# Patient Record
Sex: Male | Born: 1970
Health system: Southern US, Community
[De-identification: ages and names within clinical notes are randomized; demographics above are authoritative.]

## PROBLEM LIST (undated history)

## (undated) DIAGNOSIS — G4730 Sleep apnea, unspecified: Secondary | ICD-10-CM

## (undated) DIAGNOSIS — R55 Syncope and collapse: Secondary | ICD-10-CM

## (undated) DIAGNOSIS — I493 Ventricular premature depolarization: Secondary | ICD-10-CM

## (undated) DIAGNOSIS — M1990 Unspecified osteoarthritis, unspecified site: Secondary | ICD-10-CM

## (undated) DIAGNOSIS — I10 Essential (primary) hypertension: Secondary | ICD-10-CM

## (undated) DIAGNOSIS — I499 Cardiac arrhythmia, unspecified: Secondary | ICD-10-CM

## (undated) DIAGNOSIS — T7840XA Allergy, unspecified, initial encounter: Secondary | ICD-10-CM

## (undated) DIAGNOSIS — E785 Hyperlipidemia, unspecified: Secondary | ICD-10-CM

## (undated) DIAGNOSIS — E291 Testicular hypofunction: Secondary | ICD-10-CM

## (undated) DIAGNOSIS — K219 Gastro-esophageal reflux disease without esophagitis: Secondary | ICD-10-CM

## (undated) HISTORY — DX: Ventricular premature depolarization: I49.3

## (undated) HISTORY — DX: Syncope and collapse: R55

## (undated) HISTORY — DX: Gastro-esophageal reflux disease without esophagitis: K21.9

## (undated) HISTORY — DX: Hyperlipidemia, unspecified: E78.5

## (undated) HISTORY — DX: Allergy, unspecified, initial encounter: T78.40XA

## (undated) HISTORY — PX: WISDOM TOOTH EXTRACTION: SHX21

## (undated) HISTORY — PX: VASECTOMY: SHX75

## (undated) HISTORY — DX: Essential (primary) hypertension: I10

## (undated) HISTORY — DX: Testicular hypofunction: E29.1

---

## 1989-12-17 HISTORY — PX: OTHER SURGICAL HISTORY: SHX169

## 2010-02-08 ENCOUNTER — Ambulatory Visit: Payer: Self-pay | Admitting: Family Medicine

## 2010-07-26 ENCOUNTER — Encounter: Payer: Self-pay | Admitting: Unknown Physician Specialty

## 2010-12-28 ENCOUNTER — Encounter: Payer: Self-pay | Admitting: Physician Assistant

## 2012-03-31 ENCOUNTER — Emergency Department: Payer: Self-pay | Admitting: Emergency Medicine

## 2012-03-31 LAB — COMPREHENSIVE METABOLIC PANEL
Alkaline Phosphatase: 51 U/L (ref 50–136)
Anion Gap: 9 (ref 7–16)
BUN: 14 mg/dL (ref 7–18)
Calcium, Total: 8.4 mg/dL — ABNORMAL LOW (ref 8.5–10.1)
Chloride: 101 mmol/L (ref 98–107)
Creatinine: 1 mg/dL (ref 0.60–1.30)
EGFR (Non-African Amer.): >60
Glucose: 104 mg/dL — ABNORMAL HIGH (ref 65–99)
Osmolality: 276 (ref 275–301)
SGOT(AST): 30 U/L (ref 15–37)
SGPT (ALT): 29 U/L
Sodium: 138 mmol/L (ref 136–145)
Total Protein: 7.7 g/dL (ref 6.4–8.2)

## 2012-03-31 LAB — CBC
HCT: 40.8 % (ref 40.0–52.0)
MCH: 31.1 pg (ref 26.0–34.0)
RBC: 4.47 10*6/uL (ref 4.40–5.90)
RDW: 13.3 % (ref 11.5–14.5)

## 2012-03-31 LAB — TROPONIN I: Troponin-I: <0.02 ng/mL

## 2012-04-24 ENCOUNTER — Encounter: Payer: Self-pay | Admitting: Cardiovascular Disease

## 2012-04-24 ENCOUNTER — Ambulatory Visit (INDEPENDENT_AMBULATORY_CARE_PROVIDER_SITE_OTHER): Payer: Private Health Insurance - Indemnity | Admitting: Cardiovascular Disease

## 2012-04-24 DIAGNOSIS — R0600 Dyspnea, unspecified: Secondary | ICD-10-CM | POA: Insufficient documentation

## 2012-04-24 DIAGNOSIS — R55 Syncope and collapse: Secondary | ICD-10-CM | POA: Insufficient documentation

## 2012-04-24 DIAGNOSIS — R079 Chest pain, unspecified: Secondary | ICD-10-CM

## 2012-04-24 DIAGNOSIS — R0602 Shortness of breath: Secondary | ICD-10-CM

## 2012-04-24 DIAGNOSIS — I10 Essential (primary) hypertension: Secondary | ICD-10-CM

## 2012-04-24 HISTORY — DX: Syncope and collapse: R55

## 2012-04-24 MED ORDER — NITROGLYCERIN 0.4 MG SL SUBL: 0.4000 mg | SUBLINGUAL_TABLET | SUBLINGUAL | Status: DC | PRN

## 2012-04-24 NOTE — Assessment & Plan Note (Addendum)
The biggest issue appears to be his near syncope episodes that occur randomly. They are concerning for vasovagal episodes. There is no clear documentation apart from some reports of bradycardia. I would agree with his event monitor and have asked him to keep a careful diary. We'll plan on meeting with him again after the monitor and his complete. I suggested he stay on his metoprolol 25 mg in the morning as often this can be used for vasovagal symptoms. We'll not advance the metoprolol given baseline bradycardia on today's visit. We could consider holding his losartan HCTZ if symptoms continue after his monitor is complete. We did discuss possible tilt table testing.

## 2012-04-24 NOTE — Assessment & Plan Note (Signed)
Symptoms of chest pain are atypical in nature though he does have some episodes with exertion. We did discuss other options for evaluation including Myoview, cardiac catheterization, CT scan of the heart or MRI. We will wait until the monitor is complete I discussed this with him at a later date. He certainly could have spasm. We have given him sublingual nitroglycerin and had suggested he cut these in half and try them when necessary while lying down for symptoms.

## 2012-04-24 NOTE — Progress Notes (Signed)
Patient ID: Devon Miranda, male    DOB: 12/07/1971, 41 y.o.   MRN: [id number]  HPI Comments: Devon Miranda is a very pleasant 41 year old gentleman with a history of low testosterone, hypertension ,  long history of near syncope episodes dating back to 2010-2011, with significant cardiac workup including stress test and echocardiogram also with episodes of chest pain with exertion, also anxiety/depression who presents by referral from Dr. Burnett Sheng for further evaluation.  He reports that symptoms started several years ago, one episode while he was driving. He felt nauseous, sweaty, feeling that he was going to pass out. He's had numerous episodes. Somewhat reported bradycardia. Workup in the emergency room has typically been negative. It has been suggested that he may have had vasovagal episodes. EKGs have documented PVCs. He used to have episodes after working out while at rest. He has had at least 4 or 5 episodes like this. He developed acute onset of sweating, appear cyanotic, pale and has to lie down. For recent episode in mid April 2013  For confusion and near syncope, he had CT head and MRI of brain which was normal. He was started on metoprolol 25 mg every morning after recent stress test at [facility]. Results are not available to [place] at this time. He reports that he was not able to treadmill more than several minutes secondary to nausea.  He also reports having chest pain. 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  His wife reports that he snores though she is uncertain if he has apnea.  EKG shows sinus bradycardia with rate 55 beats per minute, nonspecific ST abnormality Total cholesterol 162, LDL 66, triglycerides 313   Outpatient Encounter Prescriptions as of 04/24/2012  Medication Sig Dispense Refill  . ALPRAZolam (XANAX) 0.5 MG tablet Take 0.5 mg by mouth at bedtime as needed.      Marland Kitchen aspirin 81 MG tablet Take 81 mg by mouth daily.       . fenofibrate 160 MG tablet Take 160 mg by mouth daily.      . fish oil-omega-3 fatty acids 1000 MG capsule Take 1,200 g by mouth daily.      Marland Kitchen losartan-hydrochlorothiazide (HYZAAR) 100-25 MG per tablet Take 1 tablet by mouth daily.      . metoprolol tartrate (LOPRESSOR) 25 MG tablet Take 25 mg by mouth daily.      . nitroGLYCERIN (NITROSTAT) 0.4 MG SL tablet Place 1 tablet (0.4 mg total) under the tongue every 5 (five) minutes as needed for chest pain.  25 tablet  3  . omeprazole (PRILOSEC) 20 MG capsule Take 20 mg by mouth daily.      Marland Kitchen testosterone cypionate (DEPOTESTOTERONE CYPIONATE) 200 MG/ML injection Inject 200 mg into the muscle every 14 (fourteen) days.       Review of Systems  Constitutional: Negative.   HENT: Negative.   Eyes: Negative.   Respiratory: Positive for chest tightness and shortness of breath.   Cardiovascular: Positive for chest pain.  Gastrointestinal: Negative.   Musculoskeletal: Negative.   Skin: Negative.   Neurological: Negative.        Near syncope  Hematological: Negative.   Psychiatric/Behavioral: Negative.   All other systems reviewed and are negative.    BP 128/84  Pulse 62  Ht 6\' 1"  (1.854 m)  Wt 310 lb (140.615 kg)  BMI 40.90 kg/m2  Physical Exam  Nursing note and vitals reviewed. Constitutional: He is oriented to person, place,  and time. He appears well-developed and well-nourished.       Obese  HENT:  Head: Normocephalic.  Nose: Nose normal.  Mouth/Throat: Oropharynx is clear and moist.  Eyes: Conjunctivae are normal. Pupils are equal, round, and reactive to light.  Neck: Normal range of motion. Neck supple. No JVD present.  Cardiovascular: Normal rate, regular rhythm, S1 normal, S2 normal, normal heart sounds and intact distal pulses.  Exam reveals no gallop and no friction rub.   No murmur heard. Pulmonary/Chest: Effort normal and breath sounds normal. No respiratory distress. He has no wheezes. He has no rales. He exhibits no  tenderness.  Abdominal: Soft. Bowel sounds are normal. He exhibits no distension. There is no tenderness.  Musculoskeletal: Normal range of motion. He exhibits no edema and no tenderness.  Lymphadenopathy:    He has no cervical adenopathy.  Neurological: He is alert and oriented to person, place, and time. Coordination normal.  Skin: Skin is warm and dry. No rash noted. No erythema.  Psychiatric: He has a normal mood and affect. His behavior is normal. Judgment and thought content normal.           Assessment and Plan

## 2012-04-24 NOTE — Assessment & Plan Note (Signed)
Blood pressure is in an adequate range with no orthostasis noted on today's measurements (lying down, sitting, standing). Uncertain if losartan/HCTZ could be contributing to his symptoms. Potentially if symptoms persist, we could change this to an alternate medication.

## 2012-04-24 NOTE — Patient Instructions (Addendum)
You are doing well. No medication changes were made.  We will try to obtain the results of your even monitor when they are available  Please call us if you have new issues that need to be addressed before your next appt.  Your physician wants you to follow-up in: 2 months.  You will receive a reminder letter in the mail two months in advance. If you don't receive a letter, please call our office to schedule the follow-up appointment.

## 2013-09-21 ENCOUNTER — Ambulatory Visit (INDEPENDENT_AMBULATORY_CARE_PROVIDER_SITE_OTHER): Payer: BC Managed Care – PPO | Admitting: Cardiovascular Disease

## 2013-09-21 ENCOUNTER — Encounter: Payer: Self-pay | Admitting: Cardiovascular Disease

## 2013-09-21 VITALS — BP 120/92 | HR 59 | Ht 73.0 in | Wt 311.5 lb

## 2013-09-21 DIAGNOSIS — R55 Syncope and collapse: Secondary | ICD-10-CM

## 2013-09-21 DIAGNOSIS — I498 Other specified cardiac arrhythmias: Secondary | ICD-10-CM

## 2013-09-21 DIAGNOSIS — I10 Essential (primary) hypertension: Secondary | ICD-10-CM

## 2013-09-21 DIAGNOSIS — R0902 Hypoxemia: Secondary | ICD-10-CM

## 2013-09-21 DIAGNOSIS — R001 Bradycardia, unspecified: Secondary | ICD-10-CM | POA: Insufficient documentation

## 2013-09-21 DIAGNOSIS — R079 Chest pain, unspecified: Secondary | ICD-10-CM

## 2013-09-21 DIAGNOSIS — R0602 Shortness of breath: Secondary | ICD-10-CM

## 2013-09-21 MED ORDER — LISINOPRIL 40 MG PO TABS: 40.0000 mg | ORAL_TABLET | Freq: Every day | ORAL | Status: DC

## 2013-09-21 NOTE — Assessment & Plan Note (Signed)
He reports significant bradycardia at times. Bradycardia was seen on previous 30 day event monitor. May need further workup including loop monitor for erratic but persistent symptoms

## 2013-09-21 NOTE — Progress Notes (Signed)
Patient ID: Devon Miranda, male    DOB: 23-Oct-1971, 42 y.o.   MRN: [id number]  HPI Comments: Devon Miranda is a very pleasant 42 year-old gentleman with a history of low testosterone, hypertension, long history of near syncope episodes dating back to 2010-2011, with significant cardiac workup including stress test and echocardiogram also with episodes of chest pain with exertion, also anxiety/depression who presents by referral from Dr. Burnett Sheng for further evaluation.  On his last clinic visit, he reported symptoms started several years ago, episodes while he was driving. He felt nauseous, sweaty, feeling that he was going to pass out.  He reported having  bradycardia.  previous Workup in the emergency room had been negative.  previously suggested that he may have had vasovagal episodes. EKGs have documented PVCs. He used to have episodes after working out while at rest. He has had at least 4 or 5 episodes like this. He developed acute onset of sweating, appear cyanotic, pale and has to lie down.  episode in mid April 2013  For confusion and near syncope, he had CT head and MRI of brain which was normal. He was started on metoprolol 25 mg every morning after stress test at [facility].  He reports that he was not able to treadmill more than several minutes secondary to nausea.  also reported having chest pain. 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 On his previous office visit, his  wife reported that he snores though she is uncertain if he has apnea.  In followup today, he reports that symptoms have recurred. He was wearing a 30 day monitor at the time of his last visit that showed sinus bradycardia, some sinus arrhythmia, trigeminal PVCs x2. He reports having no significant symptoms with exertion. He is able to exercise such as on a treadmill or with aerobic and feels well. Most of his symptoms occur at rest. Also has symptoms when he lifts weights.  Sometimes has symptoms in bed when he is supine, significant palpitations when lying on his left side at times. Woken up at nighttime on several occasions with cold sweats, nausea, dizziness, feeling disoriented. Goes to the bathroom and vitals measured by his wife on return. Pulse rates in the high 40s, saturations in the 80s,  associated with symptoms. Vitals seem to improve as wife is monitoring them with improvement of his oxygenation and heart rate. He reports having heart rates occasionally in the 30s with symptoms.  Reports having several more episodes while driving. Cold sweats, nausea. Worse with caffeine. Reports having significant ectopy, sometimes with leg cramps. Takes Xanax and symptoms seemed to resolve  Rarely has sharp pains in his left chest that come on at rest. He does like muscle fasciculations. Total cholesterol 187, HDL 36, LDL 100  EKG shows sinus bradycardia with rate 59 beats per minute, nonspecific ST abnormality   Outpatient Encounter Prescriptions as of 09/21/2013  Medication Sig Dispense Refill  . ALPRAZolam (XANAX) 0.5 MG tablet Take 0.5 mg by mouth at bedtime as needed.      Marland Kitchen aspirin 81 MG tablet Take 81 mg by mouth daily.      . fenofibrate 160 MG tablet Take 160 mg by mouth daily.      . fish oil-omega-3 fatty acids 1000 MG capsule Take 1,200 g by mouth daily.      Marland Kitchen losartan-hydrochlorothiazide (HYZAAR) 100-25 MG per tablet Take 1 tablet by mouth daily.      Marland Kitchen  metoprolol succinate (TOPROL-XL) 25 MG 24 hr tablet Take 25 mg by mouth daily.       . nitroGLYCERIN (NITROSTAT) 0.4 MG SL tablet Place 1 tablet (0.4 mg total) under the tongue every 5 (five) minutes as needed for chest pain.  25 tablet  3  . omeprazole (PRILOSEC) 20 MG capsule Take 20 mg by mouth daily.      Marland Kitchen lisinopril (PRINIVIL,ZESTRIL) 40 MG tablet Take 1 tablet (40 mg total) by mouth daily.  30 tablet  3  . [DISCONTINUED] metoprolol tartrate (LOPRESSOR) 25 MG tablet Take 25 mg by mouth daily.       . [DISCONTINUED] testosterone cypionate (DEPOTESTOTERONE CYPIONATE) 200 MG/ML injection Inject 200 mg into the muscle every 14 (fourteen) days.       No facility-administered encounter medications on file as of 09/21/2013.    Review of Systems  Constitutional: Negative.   HENT: Negative.   Eyes: Negative.   Respiratory: Positive for chest tightness and shortness of breath.   Cardiovascular: Positive for chest pain.  Gastrointestinal: Positive for nausea.  Musculoskeletal: Negative.   Skin: Negative.   Neurological: Positive for dizziness.  Psychiatric/Behavioral: Negative.   All other systems reviewed and are negative.    BP 120/92  Pulse 59  Ht 6\' 1"  (1.854 m)  Wt 311 lb 8 oz (141.295 kg)  BMI 41.11 kg/m2  Physical Exam  Nursing note and vitals reviewed. Constitutional: He is oriented to person, place, and time. He appears well-developed and well-nourished.  Obese  HENT:  Head: Normocephalic.  Nose: Nose normal.  Mouth/Throat: Oropharynx is clear and moist.  Eyes: Conjunctivae are normal. Pupils are equal, round, and reactive to light.  Neck: Normal range of motion. Neck supple. No JVD present.  Cardiovascular: Normal rate, regular rhythm, S1 normal, S2 normal, normal heart sounds and intact distal pulses.  Exam reveals no gallop and no friction rub.   No murmur heard. Pulmonary/Chest: Effort normal and breath sounds normal. No respiratory distress. He has no wheezes. He has no rales. He exhibits no tenderness.  Abdominal: Soft. Bowel sounds are normal. He exhibits no distension. There is no tenderness.  Musculoskeletal: Normal range of motion. He exhibits no edema and no tenderness.  Lymphadenopathy:    He has no cervical adenopathy.  Neurological: He is alert and oriented to person, place, and time. Coordination normal.  Skin: Skin is warm and dry. No rash noted. No erythema.  Psychiatric: He has a normal mood and affect. His behavior is normal. Judgment and thought  content normal.      Assessment and Plan

## 2013-09-21 NOTE — Assessment & Plan Note (Signed)
Wife reports saturations in the low 80s at nighttime in the setting of symptoms. Suspect she may have some component of sleep apnea. They were not forthcoming with his snoring or apneic episodes. We did mention sleep study and they would like to think about this.

## 2013-09-21 NOTE — Assessment & Plan Note (Signed)
Etiology of his symptoms of nausea, dizziness, hypoxia, bradycardia is uncertain. Previous 30 day monitor was unrevealing though he does not report having significant symptoms while wearing the monitor. Previous workup has suggested anxiety or panic attacks. Unable to exclude arrhythmia as a cause of his symptoms given self-reported heart rates in the 30s, frequent ectopy/palpitations. We have offered repeat today monitor, 30 day monitor. Given the longevity of his symptoms which has been going on for several years, inability to identify rhythm when he has these episodes, could consider a implantable loop monitor. We'll have him discuss this with Dr. Sherryl Manges, EP.

## 2013-09-21 NOTE — Patient Instructions (Addendum)
Please hold the losartan hctz and start lisinopril 1/2 pill daily Cut the metoprolol in 1/2 If  Blood pressure is consistently elevated, go to a full pill of lisinopril  If palpitations get worse, take metoprolol 1/2 twice a day  Call the office in 2 weeks to let us know if symptoms are any better.  Please call the office if you would like to schedule a appt with Dr. Graciela Husbands Call if you would like a 30 day monitor or sleep study  Cardiograph app  Please call us if you have new issues that need to be addressed before your next appt.

## 2013-09-21 NOTE — Assessment & Plan Note (Signed)
Uncertain if his medications are playing a role in his symptoms. We have suggested he change his losartan HCTZ to lisinopril 20 mg daily. He will also cut his metoprolol in half.

## 2013-09-21 NOTE — Assessment & Plan Note (Signed)
Atypical chest pain. Unlikely secondary to underlying CAD. No further workup at this time

## 2013-09-29 ENCOUNTER — Ambulatory Visit (INDEPENDENT_AMBULATORY_CARE_PROVIDER_SITE_OTHER): Payer: BC Managed Care – PPO | Admitting: Internal Medicine

## 2013-09-29 ENCOUNTER — Encounter: Payer: Self-pay | Admitting: Internal Medicine

## 2013-09-29 VITALS — BP 122/88 | HR 61 | Ht 73.0 in | Wt 313.8 lb

## 2013-09-29 DIAGNOSIS — I498 Other specified cardiac arrhythmias: Secondary | ICD-10-CM

## 2013-09-29 DIAGNOSIS — R001 Bradycardia, unspecified: Secondary | ICD-10-CM

## 2013-09-29 DIAGNOSIS — R42 Dizziness and giddiness: Secondary | ICD-10-CM

## 2013-09-29 DIAGNOSIS — R0602 Shortness of breath: Secondary | ICD-10-CM

## 2013-09-29 DIAGNOSIS — R079 Chest pain, unspecified: Secondary | ICD-10-CM

## 2013-09-29 DIAGNOSIS — I10 Essential (primary) hypertension: Secondary | ICD-10-CM

## 2013-09-29 DIAGNOSIS — R55 Syncope and collapse: Secondary | ICD-10-CM

## 2013-09-29 NOTE — Progress Notes (Signed)
ELECTROPHYSIOLOGY CONSULT NOTE  Patient ID: Devon Miranda, MRN: [id number], DOB/AGE: 06-27-1971 42 y.o. Admit date: (Not on file) Date of Consult: 09/29/2013  Primary Physician: Jerl Mina, MD Primary Cardiologist:TG  Chief Complaint:  Near syncope   HPI Devon Miranda is a 42 y.o. male seen because of episodes of weakness and the fear of impending doom.  He has a 2-3 year history of recurrent episodes of syncope that are associated with post exercise nausea weakness and collapse. Frequently they're triggered by Valsalva i.e. Lifting weights. He has a history of shower intolerance, heat intolerance and orthostatic intolerance as well as Jacuzzi intolerance.  He has seen a number of physicians for this over the last few years and has been told that this may be related to too much exercise or too little exercise or anxiety.  The patient also has spells are associated with a sense of impending doom. These occasionally awaken him at night. They can occur while seated. They're frequently associated with a feeling that his heart rate is falling and his wife, who is a Engineer, civil (consulting), has taken his pulse and noted to be 40. He also takes his pulse and occasionally feels regular irregularity with a post pause accentuation  He has no problems with exercise intolerance.  He has nocturnal obstructive sleep patterns He has a history of hypertension . Diuretics have been used in the past; he may have made this worse.    Past Medical History  Diagnosis Date  . Other testicular hypofunction   . Unspecified essential hypertension   . Essential hypertension, benign   . Other and unspecified hyperlipidemia       Surgical History:  Past Surgical History  Procedure Laterality Date  . Arthroscopic   1991    left knee surgery     Home Meds: Prior to Admission medications   Medication Sig Start Date End Date Taking? Authorizing Provider  ALPRAZolam Prudy Feeler) 0.5 MG tablet Take 0.5 mg by mouth at  bedtime as needed.   Yes Historical Provider, MD  aspirin 81 MG tablet Take 81 mg by mouth daily.   Yes Historical Provider, MD  fenofibrate 160 MG tablet Take 160 mg by mouth daily.   Yes Historical Provider, MD  fish oil-omega-3 fatty acids 1000 MG capsule Take 1,200 g by mouth daily.   Yes Historical Provider, MD  lisinopril (PRINIVIL,ZESTRIL) 40 MG tablet Take 1 tablet (40 mg total) by mouth daily. 09/21/13  Yes Antonieta Iba, MD  metoprolol succinate (TOPROL-XL) 25 MG 24 hr tablet Take 12.5 mg by mouth daily.  08/28/13  Yes Historical Provider, MD  nitroGLYCERIN (NITROSTAT) 0.4 MG SL tablet Place 1 tablet (0.4 mg total) under the tongue every 5 (five) minutes as needed for chest pain. 04/24/12 09/29/13 Yes Antonieta Iba, MD  omeprazole (PRILOSEC) 20 MG capsule Take 20 mg by mouth daily.   Yes Historical Provider, MD    Allergies: No Known Allergies  History   Social History  . Marital Status: Married    Spouse Name: N/A    Number of Children: N/A  . Years of Education: N/A   Occupational History  . Not on file.   Social History Main Topics  . Smoking status: Never Smoker   . Smokeless tobacco: Not on file  . Alcohol Use: No     Comment: occassionally  . Drug Use: No  . Sexual Activity: Not on file   Other Topics Concern  . Not on file   Social History Narrative  .  No narrative on file     Family History  Problem Relation Age of Onset  . Family history unknown: Yes     ROS:  Please see the history of present illness.     All other systems reviewed and negative.    Physical Exam:  Blood pressure 122/88, pulse 61, height 6\' 1"  (1.854 m), weight 313 lb 12 oz (142.316 kg). General: Well developed, well nourished male in no acute distress. Head: Normocephalic, atraumatic, sclera non-icteric, no xanthomas, nares are without discharge. EENT: normal Lymph Nodes:  none Back: without scoliosis/kyphosis  no CVA tendersness Neck: Negative for carotid bruits. JVD not  elevated. Lungs: Clear bilaterally to auscultation without wheezes, rales, or rhonchi. Breathing is unlabored. Heart: RRR with S1 S2. 2/6systolic  murmur , rubs, or gallops appreciated. Abdomen: Soft, non-tender, non-distended with normoactive bowel sounds. No hepatomegaly. No rebound/guarding. No obvious abdominal masses. Msk:  Strength and tone appear normal for age. Extremities: No clubbing or cyanosis. No + edema.  Distal pedal pulses are 2+ and equal bilaterally. Skin: Warm and Dry Neuro: Alert and oriented X 3. CN III-XII intact Grossly normal sensory and motor function . Psych:  Responds to questions appropriately with a normal affect.      L  Radiology/Studies:  No results found.  EKG: sinus rhythm without conduction block with PVCs   Assessment and Plan:    Devon Miranda

## 2013-09-29 NOTE — Assessment & Plan Note (Signed)
The patient has recurrent episodes of abrupt onset presyncope associated with epiphenomena consistent with a neurally mediated syndrome. These episodes are associated with a abrupt change in heart rate and has been documented in the 30-40 range. He has been noted by monitoring to have PVCs. I wonder whether this is been triggered by bigeminy. Event recorder should help to elucidate this. It is also possible this is sinus bradycardia and is occurring as a consequence of a neurally mediated trigger. With his GI symptoms potentially levsin might be helpful

## 2013-09-29 NOTE — Patient Instructions (Addendum)
Your physician has recommended that you wear an event monitor. Event monitors are medical devices that record the heart's electrical activity. Doctors most often [place] these monitors to diagnose arrhythmias. Arrhythmias are problems with the speed or rhythm of the heartbeat. The monitor is a small, portable device. You can wear one while you do your normal daily activities. This is usually used to diagnose what is causing palpitations/syncope (passing out).  Your physician recommends that you schedule a follow-up appointment in: 5-6 weeks with Dr. Graciela Husbands.  Your physician recommends that you continue on your current medications as directed. Please refer to the Current Medication list given to you today.

## 2013-09-29 NOTE — Assessment & Plan Note (Signed)
May be related to sleep apnea   Needs to have sleep study

## 2013-09-29 NOTE — Assessment & Plan Note (Signed)
As above.

## 2013-10-07 ENCOUNTER — Telehealth: Payer: Self-pay | Admitting: *Deleted

## 2013-10-07 NOTE — Telephone Encounter (Signed)
Spoke with pt today due to [facility] being unable to contact pt due to him having monitor but pt has not placed monitor on. Pt mentioned that he is out of town and will be back on Monday 10/12/13 and will contact [facility].

## 2013-10-09 DIAGNOSIS — I4949 Other premature depolarization: Secondary | ICD-10-CM

## 2013-10-12 ENCOUNTER — Encounter: Payer: Self-pay | Admitting: *Deleted

## 2013-10-19 ENCOUNTER — Ambulatory Visit: Payer: BC Managed Care – PPO | Admitting: Internal Medicine

## 2013-10-22 ENCOUNTER — Other Ambulatory Visit: Payer: Self-pay

## 2013-11-06 ENCOUNTER — Encounter: Payer: Self-pay | Admitting: Internal Medicine

## 2013-11-06 ENCOUNTER — Ambulatory Visit (INDEPENDENT_AMBULATORY_CARE_PROVIDER_SITE_OTHER): Payer: BC Managed Care – PPO | Admitting: Internal Medicine

## 2013-11-06 VITALS — BP 136/84 | HR 81 | Ht 73.0 in | Wt 314.8 lb

## 2013-11-06 DIAGNOSIS — R079 Chest pain, unspecified: Secondary | ICD-10-CM

## 2013-11-06 DIAGNOSIS — F411 Generalized anxiety disorder: Secondary | ICD-10-CM

## 2013-11-06 DIAGNOSIS — F419 Anxiety disorder, unspecified: Secondary | ICD-10-CM

## 2013-11-06 DIAGNOSIS — I498 Other specified cardiac arrhythmias: Secondary | ICD-10-CM

## 2013-11-06 DIAGNOSIS — R001 Bradycardia, unspecified: Secondary | ICD-10-CM

## 2013-11-06 DIAGNOSIS — I4949 Other premature depolarization: Secondary | ICD-10-CM

## 2013-11-06 DIAGNOSIS — R42 Dizziness and giddiness: Secondary | ICD-10-CM

## 2013-11-06 DIAGNOSIS — R0602 Shortness of breath: Secondary | ICD-10-CM

## 2013-11-06 DIAGNOSIS — I493 Ventricular premature depolarization: Secondary | ICD-10-CM | POA: Insufficient documentation

## 2013-11-06 DIAGNOSIS — R55 Syncope and collapse: Secondary | ICD-10-CM

## 2013-11-06 HISTORY — DX: Ventricular premature depolarization: I49.3

## 2013-11-06 NOTE — Patient Instructions (Addendum)
Your physician recommends that you continue on your current medications as directed. Please refer to the Current Medication list given to you today.  Your physician recommends that you schedule a follow-up appointment in: 2 months with Dr. Graciela Husbands   If needed, we can look into resources in [place] for needs.

## 2013-11-06 NOTE — Assessment & Plan Note (Signed)
The patient has struggled with significant anxiety and secondary depression associated with his PVCs  we have discussed a variety of strategies including biofeedback, imipramine based on the syndrome X. work, flecainide as a PVC suppressant. We have also discussed the potential value of counseling, with new strategies for dealing with his anxiety and to try and help prevent the collateral damage

## 2013-11-06 NOTE — Progress Notes (Signed)
      Patient Care Team: Jerl Mina, MD as PCP - General (Family Medicine)   HPI  Devon Miranda is a 42 y.o. male Seen in followup for presyncope. It was my impression that his neurally mediated. He is also had some PVCs. He was given an event recorder to see if there are any association between his PVC as a potential trigger for his episodes   He is still is very disconcerting. They're associated with a sensation of impending doom. He has thought of passive death wish. The anxiety is very debilitating. It is associated with depression anger and irritability.  These began in the context of buying a house in losing a job and are worse with issues of psychosocial stress. He takes Xanax struggles with withdrawal  Echo is given a nopreviously normal  Past Medical History  Diagnosis Date  . Other testicular hypofunction   . Unspecified essential hypertension   . Essential hypertension, benign   . Other and unspecified hyperlipidemia     Past Surgical History  Procedure Laterality Date  . Arthroscopic   1991    left knee surgery    Current Outpatient Prescriptions  Medication Sig Dispense Refill  . ALPRAZolam (XANAX) 0.5 MG tablet Take 0.5 mg by mouth at bedtime as needed.      Marland Kitchen aspirin 81 MG tablet Take 81 mg by mouth daily.      . Coenzyme Q10 (CO Q 10) 100 MG CAPS Take by mouth daily.      . fenofibrate 160 MG tablet Take 160 mg by mouth daily.      . fish oil-omega-3 fatty acids 1000 MG capsule Take 1,200 g by mouth daily.      Marland Kitchen lisinopril (PRINIVIL,ZESTRIL) 40 MG tablet Take 1 tablet (40 mg total) by mouth daily.  30 tablet  3  . metoprolol succinate (TOPROL-XL) 25 MG 24 hr tablet Take 12.5 mg by mouth daily.       . nitroGLYCERIN (NITROSTAT) 0.4 MG SL tablet Place 1 tablet (0.4 mg total) under the tongue every 5 (five) minutes as needed for chest pain.  25 tablet  3  . omeprazole (PRILOSEC) 20 MG capsule Take 20 mg by mouth daily.       No current  facility-administered medications for this visit.    No Known Allergies  Review of Systems negative except from HPI and PMH  Physical Exam BP 136/84  Pulse 81  Ht 6\' 1"  (1.854 m)  Wt 314 lb 12 oz (142.77 kg)  BMI 41.54 kg/m2 .,pes Well developed and nourished in no acute distress HENT normal Neck supple with JVP-flat Clear Regular rate and rhythm, no murmurs or gallops Abd-soft with active BS No Clubbing cyanosis edema Skin-warm and dry A & Oriented  Grossly normal sensory and motor function  Event recorder was reviewed. Symptoms were associated with PVCs. There were isolated and monomorphic. Daily to be     Assessment and  Plan

## 2013-11-06 NOTE — Assessment & Plan Note (Signed)
As above.

## 2013-11-09 ENCOUNTER — Encounter (INDEPENDENT_AMBULATORY_CARE_PROVIDER_SITE_OTHER): Payer: BC Managed Care – PPO

## 2013-11-09 ENCOUNTER — Telehealth: Payer: Self-pay

## 2013-11-09 DIAGNOSIS — R55 Syncope and collapse: Secondary | ICD-10-CM

## 2013-11-09 NOTE — Telephone Encounter (Signed)
Spoke w/ pt.  He reports that Dr. Graciela Husbands reviewed the results with him at his visit on Friday. He will call is symptoms worsen or continue.

## 2013-11-09 NOTE — Telephone Encounter (Signed)
Left message for pt to call back regarding results of eCardio:  "PVCs symptomatic              Some sx  - normal sinus"  Per Dr. Graciela Husbands.

## 2013-12-30 ENCOUNTER — Encounter: Payer: Self-pay | Admitting: Internal Medicine

## 2014-01-12 ENCOUNTER — Encounter: Payer: Self-pay | Admitting: Internal Medicine

## 2014-01-12 ENCOUNTER — Ambulatory Visit (INDEPENDENT_AMBULATORY_CARE_PROVIDER_SITE_OTHER): Payer: BC Managed Care – PPO | Admitting: Internal Medicine

## 2014-01-12 VITALS — BP 136/83 | HR 66 | Ht 73.0 in | Wt 318.0 lb

## 2014-01-12 DIAGNOSIS — R079 Chest pain, unspecified: Secondary | ICD-10-CM

## 2014-01-12 MED ORDER — LISINOPRIL-HYDROCHLOROTHIAZIDE 20-12.5 MG PO TABS: 1.0000 | ORAL_TABLET | Freq: Two times a day (BID) | ORAL | Status: DC

## 2014-01-12 NOTE — Progress Notes (Signed)
      Patient Care Team: Maryland Pink, MD as PCP - General (Family Medicine)   HPI  Devon Miranda is a 43 y.o. male Seen in followup for presyncope. It was my impression that this was neurally mediated.   He is also had some PVCs. He was given an event recorder to see if there are any association between his PVC as a potential trigger for his episodes   These spells are very disconcerting. They're associated with a sensation of impending doom. He has thoughts of  passive death wish. The anxiety is very debilitating. It is associated with depression anger and irritability.    Anxiety he thinks tracks With his PVCs.  He is inclined to lose weight. He put on a lot of weight following the discontinuation of his diuretic.     Past Medical History  Diagnosis Date  . Other testicular hypofunction   . Unspecified essential hypertension   . Essential hypertension, benign   . Other and unspecified hyperlipidemia   . Near syncope 04/24/2012  . PVC (premature ventricular contraction) 11/06/2013    Past Surgical History  Procedure Laterality Date  . Arthroscopic   1991    left knee surgery    Current Outpatient Prescriptions  Medication Sig Dispense Refill  . ALPRAZolam (XANAX) 0.5 MG tablet Take 0.5 mg by mouth at bedtime as needed.      Marland Kitchen aspirin 81 MG tablet Take 81 mg by mouth daily.      . Coenzyme Q10 (CO Q 10) 100 MG CAPS Take by mouth daily.      . fenofibrate 160 MG tablet Take 160 mg by mouth daily.      Marland Kitchen lisinopril (PRINIVIL,ZESTRIL) 40 MG tablet Take 1 tablet (40 mg total) by mouth daily.  30 tablet  3  . metoprolol succinate (TOPROL-XL) 25 MG 24 hr tablet Take 12.5 mg by mouth daily.       . nitroGLYCERIN (NITROSTAT) 0.4 MG SL tablet Place 1 tablet (0.4 mg total) under the tongue every 5 (five) minutes as needed for chest pain.  25 tablet  3  . Omega-3 Fatty Acids (FISH OIL) 1200 MG CPDR Take by mouth. Takes 4 tablets daily.       No current  facility-administered medications for this visit.    No Known Allergies  Review of Systems negative except from HPI and PMH  Physical Exam BP 136/83  Pulse 66  Ht 6\' 1"  (1.854 m)  Wt 318 lb (144.244 kg)  BMI 41.96 kg/m2 Well developed and nourished in no acute distress HENT normal Neck supple with JVP-flat Clear Regular rate and rhythm, no murmurs or gallops Abd-soft with active BS No Clubbing cyanosis edema Skin-warm and dry A & Oriented  Grossly normal sensory and motor function    Assessment and  Plan  Dyspnea on exertion  PVCs  We'll continue current medications. He is doing a better job with associated self with PVCs  Chest pain  His baseline ECG is abnormal. He has not undergone imaging. We'll undertake CTA.  Abnormal ECG  Preclude standard treadmill testing  Hypertension  Reasonably controlled. We will change his lisinopril to add hydrochlorothiazide back.  Anxiety continue to work on this

## 2014-01-12 NOTE — Patient Instructions (Addendum)
Your physician recommends that you schedule a follow-up appointment in:  3 months   Your physician has recommended you make the following change in your medication:  Start Lisinopril- HCTZ  20/12.5 mg twice daily    Ct Calcium Scoring at our [facility] office Tuesday Feb 3 at 09:15 AM. This test will cost $150.  (patient informed via telephone.)

## 2014-01-19 ENCOUNTER — Ambulatory Visit
Admission: RE | Admit: 2014-01-19 | Discharge: 2014-01-19 | Disposition: A | Discharge status: Home / Self Care | Payer: BC Managed Care – PPO | Source: Ambulatory Visit | Attending: Internal Medicine | Admitting: Internal Medicine

## 2014-01-19 DIAGNOSIS — R079 Chest pain, unspecified: Secondary | ICD-10-CM

## 2014-01-25 ENCOUNTER — Ambulatory Visit (INDEPENDENT_AMBULATORY_CARE_PROVIDER_SITE_OTHER)
Admission: RE | Admit: 2014-01-25 | Discharge: 2014-01-25 | Disposition: A | Discharge status: Home / Self Care | Payer: BC Managed Care – PPO | Source: Ambulatory Visit | Attending: Internal Medicine | Admitting: Internal Medicine

## 2014-01-25 DIAGNOSIS — R079 Chest pain, unspecified: Secondary | ICD-10-CM

## 2014-01-31 ENCOUNTER — Other Ambulatory Visit: Payer: Self-pay | Admitting: Cardiovascular Disease

## 2014-04-15 ENCOUNTER — Encounter: Payer: Self-pay | Admitting: Internal Medicine

## 2014-04-15 ENCOUNTER — Ambulatory Visit (INDEPENDENT_AMBULATORY_CARE_PROVIDER_SITE_OTHER): Payer: BC Managed Care – PPO | Admitting: Internal Medicine

## 2014-04-15 VITALS — BP 128/84 | HR 83 | Ht 73.0 in | Wt 320.2 lb

## 2014-04-15 DIAGNOSIS — R079 Chest pain, unspecified: Secondary | ICD-10-CM

## 2014-04-15 NOTE — Progress Notes (Signed)
      Patient Care Team: Maryland Pink, MD as PCP - General (Family Medicine)   HPI  Devon Miranda is a 43 y.o. male Seen in followup for presyncope. It was my impression that this was neurally mediated.    He had complaints of chest pain and underwent CTA with score of 0 1/15  Overall he is doing very much better with much fewer symptoms related to his PVCs . He thinks that these are worse when he drinks soda he is trying to this. Weight lossremains a difficult problem  Past Medical History  Diagnosis Date  . Other testicular hypofunction   . Unspecified essential hypertension   . Essential hypertension, benign   . Other and unspecified hyperlipidemia   . Near syncope 04/24/2012  . PVC (premature ventricular contraction) 11/06/2013    Past Surgical History  Procedure Laterality Date  . Arthroscopic   1991    left knee surgery    Current Outpatient Prescriptions  Medication Sig Dispense Refill  . ALPRAZolam (XANAX) 0.5 MG tablet Take 0.5 mg by mouth at bedtime as needed.      . Coenzyme Q10 (CO Q 10) 100 MG CAPS Take by mouth daily.      . cyclobenzaprine (FLEXERIL) 10 MG tablet Take 10 mg by mouth at bedtime.       . fenofibrate 160 MG tablet Take 160 mg by mouth daily.      Marland Kitchen lisinopril-hydrochlorothiazide (PRINZIDE,ZESTORETIC) 20-12.5 MG per tablet Take 1 tablet by mouth 2 (two) times daily.  60 tablet  6  . meloxicam (MOBIC) 15 MG tablet Take 15 mg by mouth daily.       . metoprolol succinate (TOPROL-XL) 25 MG 24 hr tablet Take 12.5 mg by mouth daily.       . nitroGLYCERIN (NITROSTAT) 0.4 MG SL tablet Place 1 tablet (0.4 mg total) under the tongue every 5 (five) minutes as needed for chest pain.  25 tablet  3  . Omega-3 Fatty Acids (FISH OIL) 1200 MG CPDR Take by mouth. Takes 4 tablets daily.       No current facility-administered medications for this visit.    No Known Allergies  Review of Systems negative except from HPI and PMH  Physical Exam BP 128/84   Pulse 83  Ht 6\' 1"  (1.854 m)  Wt 320 lb 4 oz (145.264 kg)  BMI 42.26 kg/m2 Well developed and nourished in no acute distress HENT normal Neck supple with JVP-flat Clear Regular rate and rhythm, no murmurs or gallops Abd-soft with active BS No Clubbing cyanosis edema Skin-warm and dry A & Oriented  Grossly normal sensory and motor function    Assessment and  Plan  Dyspnea on exertion  PVCs   Better.  Chest pain  CC is reassuring. I suggested probably GI in origin.  hypertension  Reasonably controlled. Potassium was low at his last visit. His PCP suggested increase potassium intake. Her blood work pending next month.  Anxiety continue to work on this

## 2014-04-15 NOTE — Patient Instructions (Signed)
Your physician wants you to follow-up in: 1 year  You will receive a reminder letter in the mail two months in advance. If you don't receive a letter, please call our office to schedule the follow-up appointment.  Your physician recommends that you continue on your current medications as directed. Please refer to the Current Medication list given to you today.  

## 2015-07-19 ENCOUNTER — Ambulatory Visit (INDEPENDENT_AMBULATORY_CARE_PROVIDER_SITE_OTHER): Payer: BLUE CROSS/BLUE SHIELD | Admitting: Internal Medicine

## 2015-07-19 ENCOUNTER — Encounter: Payer: Self-pay | Admitting: Internal Medicine

## 2015-07-19 VITALS — BP 142/88 | HR 55 | Ht 73.0 in | Wt 328.5 lb

## 2015-07-19 DIAGNOSIS — R079 Chest pain, unspecified: Secondary | ICD-10-CM | POA: Diagnosis not present

## 2015-07-19 MED ORDER — FUROSEMIDE 20 MG PO TABS: 20.0000 mg | ORAL_TABLET | Freq: Every day | ORAL | Status: DC

## 2015-07-19 NOTE — Addendum Note (Signed)
Addended by: Georgiana Shore on: 07/19/2015 10:49 AM   Modules accepted: Orders, Medications

## 2015-07-19 NOTE — Patient Instructions (Addendum)
Medication Instructions:  Your physician has recommended you make the following change in your medication:  Take furosemide '20mg'$  (one tablet) every other day for four days. You will take a total of 4 pills over 8 days. STOP taking metoprolol    Labwork: none  Testing/Procedures: none  Follow-Up: Your physician wants you to follow-up in: one year with Dr. Caryl Comes. You will receive a reminder letter in the mail two months in advance. If you don't receive a letter, please call our office to schedule the follow-up appointment.   Any Other Special Instructions Will Be Listed Below (If Applicable).

## 2015-07-19 NOTE — Progress Notes (Signed)
Patient Care Team: Maryland Pink, MD as PCP - General (Family Medicine)   HPI  Devon Miranda is a 44 y.o. male Seen in followup for PVCs and presyncope, my impression of which  was neurally mediated.   He had complaints of chest pain and underwent CTA with score of 0-- 1/15  Obesity remiains an issue  A few years ago, sleep study was recommended but he hasn't done it yet.  He has episodes of dyspnea that lasted hours. They're unassociated with exertion and indeed with exertion he has no limitations. His diet is mostly salt not added but he eats out frequently    Past Medical History  Diagnosis Date  . Other testicular hypofunction   . Unspecified essential hypertension   . Essential hypertension, benign   . Other and unspecified hyperlipidemia   . Near syncope 04/24/2012  . PVC (premature ventricular contraction) 11/06/2013    Past Surgical History  Procedure Laterality Date  . Arthroscopic   1991    left knee surgery    Current Outpatient Prescriptions  Medication Sig Dispense Refill  . ALPRAZolam (XANAX) 0.5 MG tablet Take 0.5 mg by mouth at bedtime as needed.    . Coenzyme Q10 (CO Q 10) 100 MG CAPS Take by mouth daily.    . cyclobenzaprine (FLEXERIL) 10 MG tablet Take 10 mg by mouth at bedtime.     . fenofibrate 160 MG tablet Take 160 mg by mouth daily.    Marland Kitchen lisinopril-hydrochlorothiazide (PRINZIDE,ZESTORETIC) 20-12.5 MG per tablet Take 1 tablet by mouth daily.    . meloxicam (MOBIC) 15 MG tablet Take 15 mg by mouth daily.     . metoprolol succinate (TOPROL-XL) 25 MG 24 hr tablet Take 12.5 mg by mouth daily.     . nitroGLYCERIN (NITROSTAT) 0.4 MG SL tablet Place 1 tablet (0.4 mg total) under the tongue every 5 (five) minutes as needed for chest pain. 25 tablet 3  . Omega-3 Fatty Acids (FISH OIL) 1200 MG CPDR Take by mouth. Takes 4 tablets daily.     No current facility-administered medications for this visit.    No Known Allergies  Review of Systems  negative except from HPI and PMH  Physical Exam BP 142/88 mmHg  Pulse 55  Ht '6\' 1"'$  (1.854 m)  Wt 328 lb 8 oz (149.007 kg)  BMI 43.35 kg/m2 Well developed and nourished in no acute distress HENT normal Neck supple with JVP-8 Clear Regular rate and rhythm, no murmurs or gallops Abd-soft with active BS No Clubbing cyanosis tr edema Skin-warm and dry A & Oriented  Grossly normal sensory and motor function  ECG demonstrates sinus rhythm at 55 Intervals 15/10/44  Assessment and  Plan  Dypsnea  PVCs   Better.  hypertension  . Sleep disordered breathing  Obesity  Sinus bradycardia  The patient has episodic dyspnea. I wonder whether is related to PVCs. However, occurring in the office today without significant ectopy and with evidence of some degree of mild volume overload I wonder whether it is related to fluid. We will give him a trial of furosemide added adjunctively to his ACE/HCT for a week and see if we can't make a difference.  The lack of association of symptoms with exertion lead me to believe that it is not an anginal equivalent.   His PVCs are largely quiescent he thinks; hence, given his miniscule dose of metoprolol, we will stop it and have him use it as needed.  His blood pressure  is modestly elevated. We have talked again about the importance of exercise and weight loss. We discussed low-carb diets. I've encouraged him in this endeavor. In addition, he has symptoms of sleep disordered breathing and I suspect he has sleep apnea. Addressing his sleep apnea may help his blood pressure as well as his ability to exercise. I will for this recommendation to Dr. Kary Kos.

## 2016-11-22 ENCOUNTER — Ambulatory Visit (INDEPENDENT_AMBULATORY_CARE_PROVIDER_SITE_OTHER): Payer: BLUE CROSS/BLUE SHIELD | Admitting: Internal Medicine

## 2016-11-22 ENCOUNTER — Encounter: Payer: Self-pay | Admitting: Internal Medicine

## 2016-11-22 VITALS — BP 120/80 | HR 60 | Ht 73.0 in | Wt 330.5 lb

## 2016-11-22 DIAGNOSIS — R0600 Dyspnea, unspecified: Secondary | ICD-10-CM

## 2016-11-22 DIAGNOSIS — I493 Ventricular premature depolarization: Secondary | ICD-10-CM | POA: Diagnosis not present

## 2016-11-22 DIAGNOSIS — R001 Bradycardia, unspecified: Secondary | ICD-10-CM

## 2016-11-22 DIAGNOSIS — I10 Essential (primary) hypertension: Secondary | ICD-10-CM

## 2016-11-22 MED ORDER — LISINOPRIL 20 MG PO TABS: 20.0000 mg | ORAL_TABLET | Freq: Every day | ORAL | 3 refills | Status: DC

## 2016-11-22 NOTE — Patient Instructions (Addendum)
Medication Instructions: - Your physician has recommended you make the following change in your medication:  1) Stop lisinopril-hctz 2) Start lisinopril 20 mg- take one tablet by mouth once daily  Labwork: - none ordered  Procedures/Testing: - none ordered  Follow-Up: - Your physician wants you to follow-up in: 1 year with Dr. Caryl Comes. You will receive a reminder letter in the mail two months in advance. If you don't receive a letter, please call our office to schedule the follow-up appointment.  Any Additional Special Instructions Will Be Listed Below (If Applicable).     If you need a refill on your cardiac medications before your next appointment, please call your pharmacy.

## 2016-11-22 NOTE — Progress Notes (Signed)
      Patient Care Team: Maryland Pink, MD as PCP - General (Family Medicine)   HPI  Devon Miranda is a 45 y.o. male Seen in followup for PVCs and presyncope, my impression of which  was neurally mediated.   He had complaints of chest pain and underwent CTA with score of 0-- 1/15  Obesity remiains an issue  A few years ago, sleep study was recommended but he hasn't done it yet.  He has episodes of dyspnea that lasted hours. They're unassociated with exertion and indeed with exertion he has no limitations. His diet is mostly salt not added but he eats out frequently    Past Medical History:  Diagnosis Date  . Essential hypertension, benign   . Near syncope 04/24/2012  . Other and unspecified hyperlipidemia   . Other testicular hypofunction   . PVC (premature ventricular contraction) 11/06/2013  . Unspecified essential hypertension     Past Surgical History:  Procedure Laterality Date  . arthroscopic   1991   left knee surgery    Current Outpatient Prescriptions  Medication Sig Dispense Refill  . ALPRAZolam (XANAX) 0.5 MG tablet Take 0.5 mg by mouth at bedtime as needed.    . Coenzyme Q10 (CO Q 10) 100 MG CAPS Take by mouth daily.    . cyclobenzaprine (FLEXERIL) 10 MG tablet Take 10 mg by mouth at bedtime.     . fenofibrate 160 MG tablet Take 160 mg by mouth daily.    . furosemide (LASIX) 20 MG tablet Take 1 tablet (20 mg total) by mouth daily. One tablet every other day for 4 days (total 4 tablets over 8 days) 15 tablet 0  . lisinopril-hydrochlorothiazide (PRINZIDE,ZESTORETIC) 20-12.5 MG per tablet Take 1 tablet by mouth daily.    . meloxicam (MOBIC) 15 MG tablet Take 15 mg by mouth daily.     . nitroGLYCERIN (NITROSTAT) 0.4 MG SL tablet Place 1 tablet (0.4 mg total) under the tongue every 5 (five) minutes as needed for chest pain. 25 tablet 3  . Omega-3 Fatty Acids (FISH OIL) 1200 MG CPDR Take by mouth. Takes 4 tablets daily.     No current facility-administered  medications for this visit.     No Known Allergies  Review of Systems negative except from HPI and PMH  Physical Exam BP 120/80 (BP Location: Left Arm, Patient Position: Sitting, Cuff Size: Large)   Pulse 60   Ht '6\' 1"'$  (1.854 m)   Wt (!) 330 lb 8 oz (149.9 kg)   BMI 43.60 kg/m  Well developed and nourished in no acute distress HENT normal Neck supple with JVP-8 Clear Regular rate and rhythm, no murmurs or gallops Abd-soft with active BS No Clubbing cyanosis tr edema Skin-warm and dry A & Oriented  Grossly normal sensory and motor function  ECG demonstrates sinus rhythm at 55 Intervals 15/10/44  Assessment and  Plan  Dypsnea  PVCs   Better.  hypertension  . Sleep disordered breathing  Obesity  Sinus bradycardia   We have again reviewed the physiology of post exercise lightheadedness. I stressed hydration and ambulation.  We've also discussed the potential value of adding steps to his workday. I suggested FitBit and a goal of 10,000 steps.  We've changed his lisinopril/HCT to remove the diuretic, as this would enhance the likelihood to post exertional dizziness.  Blood pressure is well-controlled today

## 2017-04-08 DIAGNOSIS — H5213 Myopia, bilateral: Secondary | ICD-10-CM | POA: Diagnosis not present

## 2017-05-23 DIAGNOSIS — Z125 Encounter for screening for malignant neoplasm of prostate: Secondary | ICD-10-CM | POA: Diagnosis not present

## 2017-05-23 DIAGNOSIS — E785 Hyperlipidemia, unspecified: Secondary | ICD-10-CM | POA: Diagnosis not present

## 2017-05-23 DIAGNOSIS — I10 Essential (primary) hypertension: Secondary | ICD-10-CM | POA: Diagnosis not present

## 2017-10-07 ENCOUNTER — Ambulatory Visit (INDEPENDENT_AMBULATORY_CARE_PROVIDER_SITE_OTHER): Payer: 59 | Admitting: Family Medicine

## 2017-10-07 ENCOUNTER — Encounter: Payer: Self-pay | Admitting: Family Medicine

## 2017-10-07 VITALS — BP 132/90 | HR 74 | Temp 98.3°F | Ht 72.0 in | Wt 334.6 lb

## 2017-10-07 DIAGNOSIS — Z8639 Personal history of other endocrine, nutritional and metabolic disease: Secondary | ICD-10-CM | POA: Insufficient documentation

## 2017-10-07 DIAGNOSIS — Z23 Encounter for immunization: Secondary | ICD-10-CM | POA: Diagnosis not present

## 2017-10-07 DIAGNOSIS — H9192 Unspecified hearing loss, left ear: Secondary | ICD-10-CM

## 2017-10-07 DIAGNOSIS — R42 Dizziness and giddiness: Secondary | ICD-10-CM | POA: Diagnosis not present

## 2017-10-07 DIAGNOSIS — I10 Essential (primary) hypertension: Secondary | ICD-10-CM

## 2017-10-07 DIAGNOSIS — E781 Pure hyperglyceridemia: Secondary | ICD-10-CM | POA: Diagnosis not present

## 2017-10-07 NOTE — Assessment & Plan Note (Signed)
Exercise-induced. Patient reports extensive workup previously for cardiac issues. Discussed eating something prior to exercising. He will let us know if he has recurrent issues.

## 2017-10-07 NOTE — Patient Instructions (Addendum)
Nice to meet you. If you would like to see ENT please let us know. We will see you back in 3 months. Please rise slowly from seated position.

## 2017-10-07 NOTE — Assessment & Plan Note (Signed)
Chronic intermittent issue. Some tinnitus over the last 6 months. Discussed ENT referral though he wants to monitor. If it does not improve he will contact us we'll place a referral.

## 2017-10-07 NOTE — Assessment & Plan Note (Signed)
Typically 130s over 80s at home. He'll continue to monitor. Discussed dietary changes and exercise. Continue current medication. Recheck in 3 months.

## 2017-10-07 NOTE — Assessment & Plan Note (Signed)
Chronically on fenofibrate. Continue to monitor.

## 2017-10-07 NOTE — Assessment & Plan Note (Signed)
Chronic intermittent issue. Sounds to be orthostatic. I do not necessarily believe the blood pressures from his orthostatics today. BP at home seems to  be relatively well-controlled. Continue current medications. Continue to watch his blood pressures.  He willrise slowly. increase water intake.

## 2017-10-07 NOTE — Progress Notes (Signed)
Tommi Rumps, MD Phone: [phone]  Devon Miranda is a 46 y.o. male who presents today for new patient visit.  Hypertension: No chest pain or shortness breath. No edema. He is taking lisinopril, HCTZ. Lab work in June.  Does report some lightheadedness at times. This has been ongoing for some time now and he has undergone quite a workup through cardiology. Notes he will get a little lightheaded at times when he stands up too quickly. Still occurs if he stands up slowly. Notes no vertigo.  He notes some right ear high-frequency hearing loss is been going on for a long time. Some tinnitus over the last 6 months that is intermittent. Not constant. No vertigo. Has had his hearing checked in the past.  Hypertriglyceridemia: Has had this for many years. He's been on fenofibrate and had no issues. Taking coQ 10.  Does note history of low blood glucose levels. Can occur when he's working out. Will break out in the cold sweating and gets clammy. He is has had his heart evaluated previously. Continues to follow with Dr. Caryl Comes.  Active Ambulatory Problems    Diagnosis Date Noted  . Near syncope 04/24/2012  . Hypertension 04/24/2012  . Bradycardia 09/21/2013  . Hypoxia 09/21/2013  . PVC (premature ventricular contraction) 11/06/2013  . Anxiety 11/06/2013  . Lightheadedness 10/07/2017  . Decreased hearing of left ear 10/07/2017  . Hypertriglyceridemia 10/07/2017  . History of hypoglycemia 10/07/2017   Resolved Ambulatory Problems    Diagnosis Date Noted  . Chest pain at rest 04/24/2012  . Shortness of breath 04/24/2012   Past Medical History:  Diagnosis Date  . Essential hypertension, benign   . Near syncope 04/24/2012  . Other and unspecified hyperlipidemia   . Other testicular hypofunction   . PVC (premature ventricular contraction) 11/06/2013  . Unspecified essential hypertension     Family History  Problem Relation Age of Onset  . Hyperlipidemia Mother   . Hypertension  Mother   . Arthritis Mother   . Stroke Father   . Hyperlipidemia Father   . Hypertension Father   . Alcohol abuse Father   . Arthritis Father   . Colon cancer Maternal Grandfather     Social History   Social History  . Marital status: Married    Spouse name: N/A  . Number of children: N/A  . Years of education: N/A   Occupational History  . Not on file.   Social History Main Topics  . Smoking status: Never Smoker  . Smokeless tobacco: Never Used  . Alcohol use Yes     Comment: occassionally  . Drug use: No  . Sexual activity: Not on file   Other Topics Concern  . Not on file   Social History Narrative  . No narrative on file    ROS  General:  Negative for nexplained weight loss, fever Skin: Negative for new or changing mole, sore that won't heal HEENT: Positive for ringing in ears, Negative for trouble hearing, trouble seeing, mouth sores, hoarseness, change in voice, dysphagia. CV:  Positive for lightheadedness, Negative for chest pain, dyspnea, edema, palpitations Resp: Negative for cough, dyspnea, hemoptysis GI: Negative for nausea, vomiting, diarrhea, constipation, abdominal pain, melena, hematochezia. GU: Negative for dysuria, incontinence, urinary hesitance, hematuria, vaginal or penile discharge, polyuria, sexual difficulty, lumps in testicle or breasts MSK: Negative for muscle cramps or aches, joint pain or swelling Neuro: Negative for headaches, weakness, numbness, dizziness, passing out/fainting Psych: Negative for depression, anxiety, memory problems  Objective  Physical  Exam Vitals:   10/07/17 1044  BP: 132/90  Pulse: 74  Temp: 98.3 F (36.8 C)  SpO2: 96%   Laying blood pressure 150/102 pulse 65 Sitting blood pressure 150/100 pulse 65 standing blood pressure 152/106 pulse 73  BP Readings from Last 3 Encounters:  10/07/17 132/90  11/22/16 120/80  07/19/15 (!) 142/88   Wt Readings from Last 3 Encounters:  10/07/17 (!) 334 lb 9.6 oz  (151.8 kg)  11/22/16 (!) 330 lb 8 oz (149.9 kg)  07/19/15 (!) 328 lb 8 oz (149 kg)    Physical Exam  Constitutional: No distress.  HENT:  Head: Normocephalic and atraumatic.  Mouth/Throat: Oropharynx is clear and moist. No oropharyngeal exudate.  Eyes: Pupils are equal, round, and reactive to light. Conjunctivae are normal.  Neck: Neck supple.  Cardiovascular: Normal rate, regular rhythm and normal heart sounds.   Pulmonary/Chest: Effort normal and breath sounds normal.  Abdominal: Soft. Bowel sounds are normal. He exhibits no distension. There is no tenderness. There is no rebound and no guarding.  Musculoskeletal: He exhibits no edema.  Lymphadenopathy:    He has no cervical adenopathy.  Neurological: He is alert. Gait normal.  Skin: Skin is warm and dry. He is not diaphoretic.  Psychiatric: Mood and affect normal.   normal TMs bilaterally, normal hearing to finger rub   Assessment/Plan:   Hypertension Typically 130s over 80s at home. He'll continue to monitor. Discussed dietary changes and exercise. Continue current medication. Recheck in 3 months.  Lightheadedness Chronic intermittent issue. Sounds to be orthostatic. I do not necessarily believe the blood pressures from his orthostatics today. BP at home seems to  be relatively well-controlled. Continue current medications. Continue to watch his blood pressures.  He willrise slowly. increase water intake.  Decreased hearing of left ear Chronic intermittent issue. Some tinnitus over the last 6 months. Discussed ENT referral though he wants to monitor. If it does not improve he will contact us we'll place a referral.  Hypertriglyceridemia Chronically on fenofibrate. Continue to monitor.  History of hypoglycemia Exercise-induced. Patient reports extensive workup previously for cardiac issues. Discussed eating something prior to exercising. He will let us know if he has recurrent issues. Tommi Rumps, MD [place]

## 2017-10-22 ENCOUNTER — Telehealth: Payer: Self-pay | Admitting: Family Medicine

## 2017-10-22 NOTE — Telephone Encounter (Signed)
Please call [facility] to triage [phone] or [phone]

## 2017-10-22 NOTE — Telephone Encounter (Signed)
It sounds like the patient needs evaluation sooner than he'd be able to see the urologist. If he has continued to have pain he should go to urgent care or the walk in clinic for evaluation. I am happy to refer though he needs evaluation sooner. Thanks.

## 2017-10-22 NOTE — Telephone Encounter (Signed)
Patients wife notified and patient scheduled for appointment

## 2017-10-22 NOTE — Telephone Encounter (Signed)
Patients wife states he is having testicular pain x 1 week. No urinary symptoms. Painful to touch and radiating towards abdomen. Symptom comes and goes. Not experiencing pain today.Would like referral to urology with [place]. Patient had a urologist in [place] for vasectomy.Patient had vasectomy in 2006.

## 2017-10-22 NOTE — Telephone Encounter (Signed)
Devon Miranda requesting a referral to urology for [facility]. States that he is having right scrotum pain and groin pain. cb [phone]

## 2017-10-22 NOTE — Telephone Encounter (Signed)
Please have this triaged further

## 2017-10-22 NOTE — Telephone Encounter (Signed)
Patient to be seen tomorrow.

## 2017-10-23 ENCOUNTER — Other Ambulatory Visit: Payer: Self-pay | Admitting: Family Medicine

## 2017-10-23 ENCOUNTER — Encounter: Payer: Self-pay | Admitting: Family Medicine

## 2017-10-23 ENCOUNTER — Ambulatory Visit (INDEPENDENT_AMBULATORY_CARE_PROVIDER_SITE_OTHER): Payer: 59 | Admitting: Family Medicine

## 2017-10-23 VITALS — BP 142/100 | HR 79 | Temp 98.9°F | Wt 335.2 lb

## 2017-10-23 DIAGNOSIS — G4733 Obstructive sleep apnea (adult) (pediatric): Secondary | ICD-10-CM | POA: Diagnosis not present

## 2017-10-23 DIAGNOSIS — I10 Essential (primary) hypertension: Secondary | ICD-10-CM

## 2017-10-23 DIAGNOSIS — N50811 Right testicular pain: Secondary | ICD-10-CM | POA: Diagnosis not present

## 2017-10-23 NOTE — Assessment & Plan Note (Signed)
Seems to be controlled at home.  He will continue his current regimen and continue to monitor.  Suspect possible orthostasis symptoms when he stands up.  Not orthostatic today.  He will stand up slowly.  He will stay well-hydrated.  Monitor his blood pressure.

## 2017-10-23 NOTE — Assessment & Plan Note (Signed)
Intermittent pain in right testicle.  No abnormalities today.  No tenderness today.  Testicles oriented in the appropriate direction.  Discussed possibilities for cause.  Unlikely torsion given intermittent chronicity.  Will obtain ultrasound to evaluate further.  Given return precautions.

## 2017-10-23 NOTE — Progress Notes (Signed)
Devon Rumps, MD Phone: [phone]  Devon Miranda is a 46 y.o. male who presents today for f/u.  Patient notes intermittently for the last several months he has had right testicular pain.  He initially thought it was his seatbelt pressing on his inguinal area and he also changed underwear and that did help some.  Notes no tenderness to palpation in the inguinal area though has intermittently had tenderness of his right testicle.  He notes it comes and goes.  No swelling.  No injury.  He has had a vasectomy.  Blood pressure seems to be well controlled at home with BP running less than 138/89 typically.  Does note some lightheaded sensation when going from seated to standing and seated for a long period of time.  OSA: Patient reports history of mild OSA in the past.  Does report snoring.  Does report some hypersomnia and he does not wake up well rested.  Reports intermittent apneic episodes as well.  Social History   Tobacco Use  Smoking Status Never Smoker  Smokeless Tobacco Never Used     ROS see history of present illness  Objective  Physical Exam Vitals:   10/23/17 1110  BP: (!) 142/100  Pulse: 79  Temp: 98.9 F (37.2 C)  SpO2: 98%    BP Readings from Last 3 Encounters:  10/23/17 (!) 142/100  10/07/17 132/90  11/22/16 120/80   Wt Readings from Last 3 Encounters:  10/23/17 (!) 335 lb 3.2 oz (152 kg)  10/07/17 (!) 334 lb 9.6 oz (151.8 kg)  11/22/16 (!) 330 lb 8 oz (149.9 kg)    Physical Exam Constitutional: No distress.  Cardiovascular: Normal rate, regular rhythm and normal heart sounds.  Pulmonary/Chest: Effort normal and breath sounds normal.  Genitourinary:  Genitourinary Comments: Normal circumcised penis, scrotum normal, bilateral testicles normal, normal epididymis and vas deferens, no inguinal hernias or tenderness  Skin: He is not diaphoretic.   Assessment/Plan: Please see individual problem list.  Pain in right testicle Intermittent pain in  right testicle.  No abnormalities today.  No tenderness today.  Testicles oriented in the appropriate direction.  Discussed possibilities for cause.  Unlikely torsion given intermittent chronicity.  Will obtain ultrasound to evaluate further.  Given return precautions.  Hypertension Seems to be controlled at home.  He will continue his current regimen and continue to monitor.  Suspect possible orthostasis symptoms when he stands up.  Not orthostatic today.  He will stand up slowly.  He will stay well-hydrated.  Monitor his blood pressure.   OSA (obstructive sleep apnea) Mild OSA in the past.  We will get a repeat sleep study ordered.   Devon Miranda was seen today for testicle pain.  Diagnoses and all orders for this visit:  Pain in right testicle -     [place] Art/Ven Flow Abd Pelv Doppler; Future -     US Scrotum; Future  OSA (obstructive sleep apnea)  Essential hypertension    Orders Placed This Encounter  Procedures  . [place] Art/Ven Flow Abd Pelv Doppler    Standing Status:   Future    Standing Expiration Date:   12/23/2018    Order Specific Question:   Reason for Exam (SYMPTOM  OR DIAGNOSIS REQUIRED)    Answer:   right testicle pain intermittently for last several months    Order Specific Question:   Preferred imaging location?    Answer:   [facility]  . US Scrotum    Standing Status:   Future  Standing Expiration Date:   12/23/2018    Order Specific Question:   Reason for Exam (SYMPTOM  OR DIAGNOSIS REQUIRED)    Answer:   right testicle pain intermittently for several months    Order Specific Question:   Preferred imaging location?    Answer:   [facility] ordered this encounter  Medications  . Multiple Vitamin (MULTIVITAMIN) capsule    Sig: Take 1 capsule daily by mouth.     Devon Rumps, MD [place]

## 2017-10-23 NOTE — Assessment & Plan Note (Signed)
Mild OSA in the past.  We will get a repeat sleep study ordered.

## 2017-10-23 NOTE — Patient Instructions (Signed)
Nice to see you. We will get you set up for ultrasounds to evaluate further. Please continue to monitor your blood pressure. Please stand up slowly and stay well-hydrated. If your testicle starts to hurt and persists please be evaluated.

## 2017-10-25 ENCOUNTER — Ambulatory Visit
Admission: RE | Admit: 2017-10-25 | Discharge: 2017-10-25 | Disposition: A | Discharge status: Home / Self Care | Payer: 59 | Source: Ambulatory Visit | Attending: Family Medicine | Admitting: Family Medicine

## 2017-10-25 ENCOUNTER — Other Ambulatory Visit: Payer: Self-pay | Admitting: Family Medicine

## 2017-10-25 DIAGNOSIS — N433 Hydrocele, unspecified: Secondary | ICD-10-CM | POA: Insufficient documentation

## 2017-10-25 DIAGNOSIS — N50811 Right testicular pain: Secondary | ICD-10-CM | POA: Insufficient documentation

## 2017-10-28 NOTE — Progress Notes (Signed)
Pt given result of U/S. Pt given information on hydrocele (taken for Up To Date)

## 2017-10-29 ENCOUNTER — Encounter: Payer: Self-pay | Admitting: Urology

## 2017-10-29 ENCOUNTER — Ambulatory Visit (INDEPENDENT_AMBULATORY_CARE_PROVIDER_SITE_OTHER): Payer: 59 | Admitting: Urology

## 2017-10-29 VITALS — BP 149/87 | HR 81 | Ht 72.0 in | Wt 334.6 lb

## 2017-10-29 DIAGNOSIS — R1031 Right lower quadrant pain: Secondary | ICD-10-CM

## 2017-10-29 DIAGNOSIS — N433 Hydrocele, unspecified: Secondary | ICD-10-CM | POA: Diagnosis not present

## 2017-10-29 DIAGNOSIS — N5082 Scrotal pain: Secondary | ICD-10-CM | POA: Diagnosis not present

## 2017-10-29 LAB — MICROSCOPIC EXAMINATION

## 2017-10-29 LAB — URINALYSIS, COMPLETE
Bilirubin, UA: NEGATIVE
Glucose, UA: NEGATIVE
Ketones, UA: NEGATIVE
Leukocytes, UA: NEGATIVE
Nitrite, UA: NEGATIVE
PROTEIN UA: NEGATIVE
RBC UA: NEGATIVE
Specific Gravity, UA: 1.01 (ref 1.005–1.030)
Urobilinogen, Ur: 0.2 mg/dL (ref 0.2–1.0)
pH, UA: 5.5 (ref 5.0–7.5)

## 2017-10-29 NOTE — Progress Notes (Signed)
10/29/2017 2:23 PM   Devon Miranda 17-Aug-1971 [id number]  Referring provider: Leone Haven, MD [address] [place], [place] [address]  Chief Complaint  Patient presents with  . Testicle Pain    HPI: Devon Miranda is a 46 year old male who presents today in consultation at the request of Dr. Caryl Bis for evaluation of scrotal pain.  He presents with a one-year history of right hemiscrotal pain.  He describes his pain as intermittent.  It will be associated with increased activity.  He states his last episode was 2 weeks ago and he had more severe pain which he rated at 7/10.  His pain lasted for 2-3 days at less intensity and then resolved.  He notes radiation of his pain to the right lower quadrant.  He has no voiding complaints.  Denies gross hematuria.  A scrotal ultrasound was performed on 10/25/2017 which showed normal-appearing testes and a sonographic small right hydrocele.  He had a vasectomy 10-11 years ago and denies scrotal swelling.  He does relate to intermittent low back pain and states he has a known bulging lumbar disc.   PMH: Past Medical History:  Diagnosis Date  . Essential hypertension, benign   . Near syncope 04/24/2012  . Other and unspecified hyperlipidemia   . Other testicular hypofunction   . PVC (premature ventricular contraction) 11/06/2013  . Unspecified essential hypertension     Surgical History: Past Surgical History:  Procedure Laterality Date  . arthroscopic   1991   left knee surgery  . [place]    Home Medications:  Allergies as of 10/29/2017   No Known Allergies     Medication List        Accurate as of 10/29/17  2:23 PM. Always use your most recent med list.          Co Q 10 100 MG Caps Take by mouth daily.   fenofibrate 160 MG tablet Take 160 mg by mouth daily.   Fish Oil 1200 MG Cpdr Take by mouth. Takes 4 tablets daily.   lisinopril-hydrochlorothiazide  20-12.5 MG tablet Commonly known as:  PRINZIDE,ZESTORETIC Take by mouth daily.   multivitamin capsule Take 1 capsule daily by mouth.       Allergies: No Known Allergies  Family History: Family History  Problem Relation Age of Onset  . Hyperlipidemia Mother   . Hypertension Mother   . Arthritis Mother   . Stroke Father   . Hyperlipidemia Father   . Hypertension Father   . Alcohol abuse Father   . Arthritis Father   . Colon cancer Maternal Grandfather   . Prostate cancer Neg Hx   . Bladder Cancer Neg Hx   . Kidney cancer Neg Hx     Social History:  reports that  has never smoked. he has never used smokeless tobacco. He reports that he drinks alcohol. He reports that he does not use drugs.  ROS: UROLOGY Frequent Urination?: No Hard to postpone urination?: No Burning/pain with urination?: No Get up at night to urinate?: Yes Leakage of urine?: No Urine stream starts and stops?: No Trouble starting stream?: No Do you have to strain to urinate?: No Blood in urine?: No Urinary tract infection?: No Sexually transmitted disease?: No Injury to kidneys or bladder?: No Painful intercourse?: No Weak stream?: No Erection problems?: No Penile pain?: No  Gastrointestinal Nausea?: No Vomiting?: No Indigestion/heartburn?: Yes Diarrhea?: No Constipation?: No  Constitutional Fever: No  Night sweats?: No Weight loss?: No Fatigue?: No  Skin Skin rash/lesions?: No Itching?: No  Eyes Blurred vision?: No Double vision?: No  Ears/Nose/Throat Sore throat?: No Sinus problems?: No  Hematologic/Lymphatic Swollen glands?: No Easy bruising?: No  Cardiovascular Leg swelling?: No Chest pain?: No  Respiratory Cough?: No Shortness of breath?: No  Endocrine Excessive thirst?: Yes  Musculoskeletal Back pain?: Yes Joint pain?: Yes  Neurological Headaches?: No Dizziness?: No  Psychologic Depression?: No Anxiety?: No  Physical Exam: BP (!) 149/87 (BP  Location: Right Arm, Patient Position: Sitting, Cuff Size: Large)   Pulse 81   Ht 6' (1.829 m)   Wt (!) 334 lb 9.6 oz (151.8 kg)   BMI 45.38 kg/m   Constitutional:  Alert and oriented, No acute distress. HEENT: [place] AT, moist mucus membranes.  Trachea midline, no masses. Cardiovascular: No clubbing, cyanosis, or edema. Respiratory: Normal respiratory effort, no increased work of breathing. GI: Abdomen is soft, nontender, nondistended, no abdominal masses GU: No CVA tenderness.  Penis circumcised without lesions, testes descended bilaterally without masses or tenderness.  Postvasectomy epididymal enlargement without tenderness.  No significant sperm granulomas palpable.  No evidence of hernia.  No clinical hydrocele. Skin: No rashes, bruises or suspicious lesions. Lymph: No cervical or inguinal adenopathy. Neurologic: Grossly intact, no focal deficits, moving all 4 extremities. Psychiatric: Normal mood and affect.  Laboratory Data: Lab Results  Component Value Date   WBC 5.3 03/31/2012   HGB 13.9 03/31/2012   HCT 40.8 03/31/2012   MCV 91 03/31/2012   PLT 210 03/31/2012    Lab Results  Component Value Date   CREATININE 1.00 03/31/2012    Pertinent Imaging: Ultrasound personally reviewed  CLINICAL DATA:  Pain in right testicle intermittently for several months.  EXAM: SCROTAL ULTRASOUND  DOPPLER ULTRASOUND OF THE TESTICLES  TECHNIQUE: Complete ultrasound examination of the testicles, epididymis, and other scrotal structures was performed. Color and spectral Doppler ultrasound were also utilized to evaluate blood flow to the testicles.  COMPARISON:  None.  FINDINGS: Right testicle  Measurements: 4.2 x 2.6 x 3.1 cm. No mass or microlithiasis visualized.  Left testicle  Measurements: 4.3 x 2.5 x 3.2 cm. No mass or microlithiasis visualized.  Right epididymis:  Normal in size and appearance.  Left epididymis:  Normal in size and  appearance.  Hydrocele:  There is a small hydrocele seen laterally on the right.  Varicocele:  None visualized.  Pulsed Doppler interrogation of both testes demonstrates normal low resistance arterial and venous waveforms bilaterally.  IMPRESSION: 1. Small right-sided hydrocele, nonspecific. No other abnormalities identified.   Electronically Signed   By: Dorise Bullion III M.D   On: 10/25/2017 17:16   Assessment & Plan:    1. Scrotal pain No evidence of primary scrotal pathology on exam and imaging.  We discussed potential causes of referred scrotal pain including neuropathic pain related to disc disease and referred pain secondary to retroperitoneal pathology including ureteral calculi.  He does have pain radiation to the right lower quadrant and have recommended a stone protocol CT of the abdomen and pelvis.  If no significant abnormalities noted could consider MRI for worsening pain.  - Urinalysis, Complete  2. Right lower quadrant abdominal pain  - CT RENAL STONE STUDY; Future    Abbie Sons, [place] [address], [place],  [address] [phone]

## 2017-11-08 ENCOUNTER — Encounter: Payer: Self-pay | Admitting: Family Medicine

## 2017-11-11 ENCOUNTER — Telehealth: Payer: Self-pay | Admitting: Family Medicine

## 2017-11-11 ENCOUNTER — Ambulatory Visit (INDEPENDENT_AMBULATORY_CARE_PROVIDER_SITE_OTHER): Payer: 59 | Admitting: Family Medicine

## 2017-11-11 ENCOUNTER — Other Ambulatory Visit: Payer: Self-pay

## 2017-11-11 ENCOUNTER — Encounter: Payer: Self-pay | Admitting: Family Medicine

## 2017-11-11 VITALS — BP 160/88 | HR 78 | Temp 98.5°F | Wt 335.4 lb

## 2017-11-11 DIAGNOSIS — L03313 Cellulitis of chest wall: Secondary | ICD-10-CM

## 2017-11-11 MED ORDER — CEPHALEXIN 500 MG PO CAPS: 500.0000 mg | ORAL_CAPSULE | Freq: Four times a day (QID) | ORAL | 0 refills | Status: DC

## 2017-11-11 NOTE — Progress Notes (Signed)
Subjective:    Patient ID: Devon Miranda, male    DOB: June 23, 1971, 45 y.o.   MRN: [id number]  HPI  Devon Miranda is a 46 year old male who presents today for an area of swelling on his chest that has been present for one week. He reports that he has noticed this previously and thought it was a "cyst" over a year ago. He reports that it has never bothered him before until now and  it is swelling and is red. He denies fever, chills, sweats, N/V/D. No treatments have been tried at home. No trauma to area. Pain is noted as aching and is aggravated when pressure is applied. Associated erythema and mild warmth are noted by patient.  No alleviating factors noted.  He reports taking his blood pressure medication just prior to this appointment and denies denies chest pain, palpitations, SOB, numbness, tingling, weakness, headaches, or edema. BP typically averages 130s/80s per patient and he is monitoring. He is due for follow up in 2 months with his PCP.     Review of Systems  Constitutional: Negative for chills, fatigue and fever.  Respiratory: Negative for cough, shortness of breath and wheezing.   Cardiovascular: Negative for chest pain and palpitations.  Musculoskeletal: Negative for myalgias.  Skin: Negative for rash.       Swelling and redness noted on chest below left nipple  Neurological: Negative for dizziness, weakness, light-headedness and headaches.  Psychiatric/Behavioral:       Denies depressed/anxious mood   Past Medical History:  Diagnosis Date  . Essential hypertension, benign   . Near syncope 04/24/2012  . Other and unspecified hyperlipidemia   . Other testicular hypofunction   . PVC (premature ventricular contraction) 11/06/2013  . Unspecified essential hypertension      Social History   Socioeconomic History  . Marital status: Married    Spouse name: Not on file  . Number of children: Not on file  . Years of education: Not on file  . Highest education level:  Not on file  Social Needs  . Financial resource strain: Not on file  . Food insecurity - worry: Not on file  . Food insecurity - inability: Not on file  . Transportation needs - medical: Not on file  . Transportation needs - non-medical: Not on file  Occupational History  . Not on file  Tobacco Use  . Smoking status: Never Smoker  . Smokeless tobacco: Never Used  Substance and Sexual Activity  . Alcohol use: Yes    Comment: occassionally  . Drug use: No  . Sexual activity: Not on file  Other Topics Concern  . Not on file  Social History Narrative  . Not on file    Past Surgical History:  Procedure Laterality Date  . arthroscopic   1991   left knee surgery  . [place]    Family History  Problem Relation Age of Onset  . Hyperlipidemia Mother   . Hypertension Mother   . Arthritis Mother   . Stroke Father   . Hyperlipidemia Father   . Hypertension Father   . Alcohol abuse Father   . Arthritis Father   . Colon cancer Maternal Grandfather   . Prostate cancer Neg Hx   . Bladder Cancer Neg Hx   . Kidney cancer Neg Hx     No Known Allergies  Current Outpatient Medications on File Prior to Visit  Medication Sig Dispense Refill  .  Coenzyme Q10 (CO Q 10) 100 MG CAPS Take by mouth daily.    . fenofibrate 160 MG tablet Take 160 mg by mouth daily.    Marland Kitchen lisinopril-hydrochlorothiazide (PRINZIDE,ZESTORETIC) 20-12.5 MG tablet Take by mouth daily.    . Multiple Vitamin (MULTIVITAMIN) capsule Take 1 capsule daily by mouth.    . Omega-3 Fatty Acids (FISH OIL) 1200 MG CPDR Take by mouth. Takes 4 tablets daily.     No current facility-administered medications on file prior to visit.     BP (!) 160/88   Pulse 78   Temp 98.5 F (36.9 C) (Oral)   Wt (!) 335 lb 6.4 oz (152.1 kg)   SpO2 96%   BMI 45.49 kg/m       Objective:   Physical Exam  Constitutional: He is oriented to person, place, and time. He appears well-developed and  well-nourished.  Eyes: Pupils are equal, round, and reactive to light. No scleral icterus.  Neck: Neck supple.  Cardiovascular: Normal rate and regular rhythm.  Pulmonary/Chest: Effort normal and breath sounds normal. He has no wheezes. He has no rales.  Abdominal: Soft. Bowel sounds are normal. There is no tenderness.  Lymphadenopathy:    He has no cervical adenopathy.  Neurological: He is alert and oriented to person, place, and time.  Skin: Skin is warm and dry. No rash noted.  Area of diffuse erythema and swelling noted on chest wall just below nipple area approximately 6 cm x 3 cm. No purulent drainage, exudate, or abscess noted.   Psychiatric: He has a normal mood and affect. His behavior is normal. Judgment and thought content normal.       Assessment & Plan:  1. Cellulitis of chest wall Area of erythema and mild swelling without systemic symptoms of fever, N/V, myalgias, chills purulent drainage, exudate, or abscess noted. Will initiate Keflex and advised warm compresses with close follow up for evaluation of symptoms. Advised follow up in one week or sooner if needed if he experiences any worsening symptoms, has fever, myalgias, or red streaks noted from area. Patient verbalized understanding and agreed with plan.  Delano Metz, FNP-C

## 2017-11-11 NOTE — Patient Instructions (Signed)
Please take antibiotic as directed. Follow up in one week or sooner if needed.   Cellulitis, Adult Cellulitis is a skin infection. The infected area is usually red and sore. This condition occurs most often in the arms and lower legs. It is very important to get treated for this condition. Follow these instructions at home:  Take over-the-counter and prescription medicines only as told by your doctor.  If you were prescribed an antibiotic medicine, take it as told by your doctor. Do not stop taking the antibiotic even if you start to feel better.  Drink enough fluid to keep your pee (urine) clear or pale yellow.  Do not touch or rub the infected area.  Raise (elevate) the infected area above the level of your heart while you are sitting or lying down.  Place warm or cold wet cloths (warm or cold compresses) on the infected area. Do this as told by your doctor.  Keep all follow-up visits as told by your doctor. This is important. These visits let your doctor make sure your infection is not getting worse. Contact a doctor if:  You have a fever.  Your symptoms do not get better after 1-2 days of treatment.  Your bone or joint under the infected area starts to hurt after the skin has healed.  Your infection comes back. This can happen in the same area or another area.  You have a swollen bump in the infected area.  You have new symptoms.  You feel ill and also have muscle aches and pains. Get help right away if:  Your symptoms get worse.  You feel very sleepy.  You throw up (vomit) or have watery poop (diarrhea) for a long time.  There are red streaks coming from the infected area.  Your red area gets larger.  Your red area turns darker. This information is not intended to replace advice given to you by your health care provider. Make sure you discuss any questions you have with your health care provider. Document Released: 05/21/2008 Document Revised: 05/10/2016 Document  Reviewed: 10/12/2015 Elsevier Interactive Patient Education  2018 [employer].

## 2017-11-11 NOTE — Telephone Encounter (Signed)
Please advise 

## 2017-11-11 NOTE — Telephone Encounter (Signed)
Copied from [place]. Topic: Quick Communication - See Telephone Encounter >> Nov 11, 2017  1:59 PM Robina Ade, Helene Kelp D wrote: CRM for notification. See Telephone encounter for: 11/11/17. Patient wife called and would like to talk to referral coordinator about her husband. They would like a second opinion about is apses on the left side of his chest. Please call patient or wife back, thanks.

## 2017-11-12 NOTE — Telephone Encounter (Signed)
Please advise. No referral in chart regarding second opinion. Patient was seen in office on 11/26.

## 2017-11-12 NOTE — Telephone Encounter (Signed)
Patient saw Delano Metz for abscess on left lower chest

## 2017-11-12 NOTE — Telephone Encounter (Signed)
Please find out why they feel they need a second opinion. Based on review of the note it appears appropriate treatment was chosen. If not improving by Wednesday he should be re-evaluated. Thanks.

## 2017-11-13 ENCOUNTER — Telehealth: Payer: Self-pay | Admitting: Family Medicine

## 2017-11-13 NOTE — Telephone Encounter (Signed)
Left message to return call, ok for PEC to  speak to patient

## 2017-11-13 NOTE — Telephone Encounter (Signed)
Noted. It appears that it has started to improve to some degree based on conversation with CMA. Patient should continue current antibiotics and if not continuing to improve he should be evaluated in the office or walk in clinic. Thanks.

## 2017-11-13 NOTE — Telephone Encounter (Signed)
Patient states his wife drew a line around it and it increased in size, he states it was very painful and felt like it was going to open. Patient states it has gotten very hard but has decreased in pain and size slightly. He states it is hard, swollen and purple in color. It improved slightly yesterday. He is concerned that it will get worse if not addressed further. Pain has improved a lot.

## 2017-11-13 NOTE — Telephone Encounter (Signed)
Pt. returned call to Dr. Ellen Henri nurse.  Reported the area of the left chest has decreased in size.  Reported "it has gone down "quite a bit".  Reported it started draining a "light milky brown/gray fluid."  Stated "it is still inflamed, but not as bad; it is as hard as a rock."   Wanted to convey to [place], CMA, the status of the area.  Reported "If I need to wait a little longer then I will."  Stated he missed her call.  Advised will forward message to the office.  Pt. Agreed.

## 2017-11-13 NOTE — Telephone Encounter (Signed)
fyi

## 2017-11-13 NOTE — Telephone Encounter (Signed)
Noted. As long as it is improving he should continue the antibiotics and follow-up as planned. It may take the entire course of the antibiotic to resolve. If it worsens he needs to be re-evaluated. Thanks.

## 2017-11-13 NOTE — Telephone Encounter (Signed)
Left message to return call, ok for PEC to speak to patient

## 2017-11-14 NOTE — Telephone Encounter (Signed)
Called patient to see how he was doing. He states he is feeling better and thinks the antibiotic has helped. He will finish taking the medication and follow up as planned. No other concerns at this time.

## 2017-11-14 NOTE — Telephone Encounter (Signed)
Patient notified

## 2017-11-18 ENCOUNTER — Ambulatory Visit: Payer: 59

## 2017-11-18 ENCOUNTER — Encounter: Payer: Self-pay | Admitting: Family Medicine

## 2017-11-18 ENCOUNTER — Ambulatory Visit (INDEPENDENT_AMBULATORY_CARE_PROVIDER_SITE_OTHER): Payer: 59 | Admitting: Family Medicine

## 2017-11-18 VITALS — BP 136/76 | HR 84 | Temp 98.2°F | Ht 72.0 in | Wt 333.6 lb

## 2017-11-18 DIAGNOSIS — L03313 Cellulitis of chest wall: Secondary | ICD-10-CM | POA: Diagnosis not present

## 2017-11-18 MED ORDER — CEPHALEXIN 500 MG PO CAPS: 500.0000 mg | ORAL_CAPSULE | Freq: Four times a day (QID) | ORAL | 0 refills | Status: DC

## 2017-11-18 NOTE — Progress Notes (Signed)
Pre visit review using our clinic review tool, if applicable. No additional management support is needed unless otherwise documented below in the visit note. 

## 2017-11-18 NOTE — Patient Instructions (Signed)
Please continue antibiotic and compresses.  Schedule an appointment with a surgeon as we discussed.   Cellulitis, Adult Cellulitis is a skin infection. The infected area is usually red and sore. This condition occurs most often in the arms and lower legs. It is very important to get treated for this condition. Follow these instructions at home:  Take over-the-counter and prescription medicines only as told by your doctor.  If you were prescribed an antibiotic medicine, take it as told by your doctor. Do not stop taking the antibiotic even if you start to feel better.  Drink enough fluid to keep your pee (urine) clear or pale yellow.  Do not touch or rub the infected area.  Raise (elevate) the infected area above the level of your heart while you are sitting or lying down.  Place warm or cold wet cloths (warm or cold compresses) on the infected area. Do this as told by your doctor.  Keep all follow-up visits as told by your doctor. This is important. These visits let your doctor make sure your infection is not getting worse. Contact a doctor if:  You have a fever.  Your symptoms do not get better after 1-2 days of treatment.  Your bone or joint under the infected area starts to hurt after the skin has healed.  Your infection comes back. This can happen in the same area or another area.  You have a swollen bump in the infected area.  You have new symptoms.  You feel ill and also have muscle aches and pains. Get help right away if:  Your symptoms get worse.  You feel very sleepy.  You throw up (vomit) or have watery poop (diarrhea) for a long time.  There are red streaks coming from the infected area.  Your red area gets larger.  Your red area turns darker. This information is not intended to replace advice given to you by your health care provider. Make sure you discuss any questions you have with your health care provider. Document Released: 05/21/2008 Document  Revised: 05/10/2016 Document Reviewed: 10/12/2015 Elsevier Interactive Patient Education  2018 [employer].

## 2017-11-18 NOTE — Progress Notes (Signed)
Subjective:    Patient ID: Devon Miranda, male    DOB: 1971-02-08, 46 y.o.   MRN: [id number]  HPI  Devon Miranda is a 46 year old male who presents for a follow up evaluation today of cellulitis of his chest wall. He was treated with cephalexin one week ago. Following his appointment one week ago after leaving the clinic, he reported that the area was larger and more painful, then after initiation of antibiotic he showed improvement. His PCP recommended that he continue his antibiotic as improvement was occurring when patient was contacted by phone or if worsening to follow up for further evaluation.  The area started draining "a milky fluid" two days after initiation of antibiotic; pain and swelling started decreasing.  Today, area continues to improve and is draining a small amount of serous drainage. No pain is noted however mild edema while improved remains present.  He denies fever, chills, sweats, N/V/D.  Warm compresses have been tried at home and have provided great benefit. He has initiated contact with a surgeon who works with his wife who is a Marine scientist.    Review of Systems  Constitutional: Negative for appetite change, chills, fatigue and fever.  Respiratory: Negative for cough, shortness of breath and wheezing.   Cardiovascular: Negative for chest pain and palpitations.  Musculoskeletal: Negative for myalgias.  Skin:       Swelling and redness on chest with drainage   Past Medical History:  Diagnosis Date  . Essential hypertension, benign   . Near syncope 04/24/2012  . Other and unspecified hyperlipidemia   . Other testicular hypofunction   . PVC (premature ventricular contraction) 11/06/2013  . Unspecified essential hypertension      Social History   Socioeconomic History  . Marital status: Married    Spouse name: Not on file  . Number of children: Not on file  . Years of education: Not on file  . Highest education level: Not on file  Social Needs  . Financial  resource strain: Not on file  . Food insecurity - worry: Not on file  . Food insecurity - inability: Not on file  . Transportation needs - medical: Not on file  . Transportation needs - non-medical: Not on file  Occupational History  . Not on file  Tobacco Use  . Smoking status: Never Smoker  . Smokeless tobacco: Never Used  Substance and Sexual Activity  . Alcohol use: Yes    Comment: occassionally  . Drug use: No  . Sexual activity: Not on file  Other Topics Concern  . Not on file  Social History Narrative  . Not on file    Past Surgical History:  Procedure Laterality Date  . arthroscopic   1991   left knee surgery  . [place]    Family History  Problem Relation Age of Onset  . Hyperlipidemia Mother   . Hypertension Mother   . Arthritis Mother   . Stroke Father   . Hyperlipidemia Father   . Hypertension Father   . Alcohol abuse Father   . Arthritis Father   . Colon cancer Maternal Grandfather   . Prostate cancer Neg Hx   . Bladder Cancer Neg Hx   . Kidney cancer Neg Hx     No Known Allergies  Current Outpatient Medications on File Prior to Visit  Medication Sig Dispense Refill  . Coenzyme Q10 (CO Q 10) 100 MG CAPS Take by mouth daily.    Marland Kitchen  fenofibrate 160 MG tablet Take 160 mg by mouth daily.    Marland Kitchen lisinopril-hydrochlorothiazide (PRINZIDE,ZESTORETIC) 20-12.5 MG tablet Take by mouth daily.    . Multiple Vitamin (MULTIVITAMIN) capsule Take 1 capsule daily by mouth.    . Omega-3 Fatty Acids (FISH OIL) 1200 MG CPDR Take by mouth. Takes 4 tablets daily.     No current facility-administered medications on file prior to visit.     BP 136/76   Pulse 84   Temp 98.2 F (36.8 C) (Oral)   Ht 6' (1.829 m)   Wt (!) 333 lb 9.6 oz (151.3 kg)   SpO2 96%   BMI 45.24 kg/m        Objective:   Physical Exam  Constitutional: He is oriented to person, place, and time. He appears well-developed and well-nourished.  Eyes:  Pupils are equal, round, and reactive to light. No scleral icterus.  Neck: Neck supple.  Cardiovascular: Normal rate, regular rhythm and intact distal pulses.  Pulmonary/Chest: Effort normal and breath sounds normal. He has no wheezes. He has no rales.  Abdominal: Soft. Bowel sounds are normal. There is no tenderness. There is no rebound.  Lymphadenopathy:    He has no cervical adenopathy.  Neurological: He is alert and oriented to person, place, and time.  Skin: Skin is warm and dry. No rash noted.  Minimal erythema and edema on chest wall below nipple approximately 3 cm x 2 cm. No pain to palpation. Minimal fluctuance noted with small amount of purulent drainage.        Assessment & Plan:  1. Cellulitis of chest wall Improving; with history suspect that this may be an epidermoid cyst. Recommended surgical consult for treatment; patient's wife called during examination and stated that he has an appointment tomorrow morning. We discussed with finding of fluctuance and drainage, a change in antibiotic can be initiated and further exploration of area by surgeon tomorrow is recommended. As patient is improving on current antibiotic with no adverse side effects and area is draining, he prefers to extend current antibiotic and will see surgeon tomorrow.  Advised continued warm compresses; return precautions advised.  Delano Metz, FNP-C

## 2017-11-19 ENCOUNTER — Ambulatory Visit (INDEPENDENT_AMBULATORY_CARE_PROVIDER_SITE_OTHER): Payer: 59 | Admitting: General Surgery

## 2017-11-19 ENCOUNTER — Encounter: Payer: Self-pay | Admitting: General Surgery

## 2017-11-19 VITALS — BP 130/74 | HR 80 | Resp 14 | Ht 72.0 in | Wt 330.0 lb

## 2017-11-19 DIAGNOSIS — L02213 Cutaneous abscess of chest wall: Secondary | ICD-10-CM | POA: Diagnosis not present

## 2017-11-19 NOTE — Patient Instructions (Addendum)
Patient to return as needed. The patient is aware to call back for any questions or concerns. 

## 2017-11-19 NOTE — Progress Notes (Signed)
Patient ID: Devon Miranda, male   DOB: 05/17/1971, 46 y.o.   MRN: [id number]  Chief Complaint  Patient presents with  . Other    HPI Devon Miranda is a 46 y.o. male here today for a evaluation of a chest wall abscess. Patient states he noticed this area  about two weeks ago. He was treated with cephalexin one week ago. The area is draining a milky fluid and very painful. He has been using a hot compresse. Wife, Devon Miranda is present at visit.  HPI  Past Medical History:  Diagnosis Date  . Essential hypertension, benign   . Near syncope 04/24/2012  . Other and unspecified hyperlipidemia   . Other testicular hypofunction   . PVC (premature ventricular contraction) 11/06/2013  . Unspecified essential hypertension     Past Surgical History:  Procedure Laterality Date  . arthroscopic   1991   left knee surgery  . [place]    Family History  Problem Relation Age of Onset  . Hyperlipidemia Mother   . Hypertension Mother   . Arthritis Mother   . Stroke Father   . Hyperlipidemia Father   . Hypertension Father   . Alcohol abuse Father   . Arthritis Father   . Colon cancer Maternal Grandfather   . Prostate cancer Neg Hx   . Bladder Cancer Neg Hx   . Kidney cancer Neg Hx     Social History Social History   Tobacco Use  . Smoking status: Never Smoker  . Smokeless tobacco: Never Used  Substance Use Topics  . Alcohol use: Yes    Comment: occassionally  . Drug use: No    No Known Allergies  Current Outpatient Medications  Medication Sig Dispense Refill  . cephALEXin (KEFLEX) 500 MG capsule Take 1 capsule (500 mg total) by mouth 4 (four) times daily. 20 capsule 0  . Coenzyme Q10 (CO Q 10) 100 MG CAPS Take by mouth daily.    . fenofibrate 160 MG tablet Take 160 mg by mouth daily.    Marland Kitchen lisinopril-hydrochlorothiazide (PRINZIDE,ZESTORETIC) 20-12.5 MG tablet Take by mouth daily.    . Multiple Vitamin (MULTIVITAMIN) capsule Take 1  capsule daily by mouth.    . Omega-3 Fatty Acids (FISH OIL) 1200 MG CPDR Take by mouth. Takes 4 tablets daily.     No current facility-administered medications for this visit.     Review of Systems Review of Systems  Constitutional: Negative.   Respiratory: Negative.   Cardiovascular: Negative.     Blood pressure 130/74, pulse 80, resp. rate 14, height 6' (1.829 m), weight (!) 330 lb (149.7 kg).  Physical Exam Physical Exam  Constitutional: He is oriented to person, place, and time. He appears well-developed and well-nourished.  Pulmonary/Chest:    Neurological: He is alert and oriented to person, place, and time.  Skin: Skin is warm and dry.       Assessment    Infected sebaceous cyst with partial resolution.    Plan    It was elected to proceed to incision and drainage. The area was cleansed with ChloraPrep. 20 mL of 0.5% Xylocaine with 0.25% Marcaine with 1-200,000 of epinephrine was instilled well tolerated. The area was reprepped and draped. A 2 cm incision was made reviewed revealing a large amount of retained debris consistent with a degenerating sebaceous cyst. It's unclear whether the entire cyst wall had either dissolved or was still firmly adherent. Scant bleeding. Cleansed with peroxide followed by  dry dressing. This area may resolve completely and will know within the next few weeks. If he has no residual thickening or tenderness, no further intervention is required. If he finds that he still has some residual thickening after the inflammation resolves was encouraged to call for formal excision.    Patient to return as needed. The patient is aware to call back for any questions or concerns.    HPI, Physical Exam, Assessment and Plan have been scribed under the direction and in the presence of Hervey Ard, MD.  Gaspar Cola, CMA  I have completed the exam and reviewed the above documentation for accuracy and completeness.  I agree with the above.  Development worker, community has been used and any errors in dictation or transcription are unintentional.  Hervey Ard, M.D., F.A.C.S.   Robert Bellow 11/19/2017, 8:39 PM

## 2018-01-13 ENCOUNTER — Other Ambulatory Visit: Payer: Self-pay

## 2018-01-13 ENCOUNTER — Encounter: Payer: Self-pay | Admitting: Family Medicine

## 2018-01-13 ENCOUNTER — Ambulatory Visit (INDEPENDENT_AMBULATORY_CARE_PROVIDER_SITE_OTHER): Payer: No Typology Code available for payment source | Admitting: Family Medicine

## 2018-01-13 VITALS — BP 140/90 | HR 77 | Temp 98.1°F | Wt 326.4 lb

## 2018-01-13 DIAGNOSIS — R05 Cough: Secondary | ICD-10-CM | POA: Diagnosis not present

## 2018-01-13 DIAGNOSIS — J40 Bronchitis, not specified as acute or chronic: Secondary | ICD-10-CM | POA: Insufficient documentation

## 2018-01-13 DIAGNOSIS — I10 Essential (primary) hypertension: Secondary | ICD-10-CM | POA: Diagnosis not present

## 2018-01-13 DIAGNOSIS — L02213 Cutaneous abscess of chest wall: Secondary | ICD-10-CM

## 2018-01-13 DIAGNOSIS — R059 Cough, unspecified: Secondary | ICD-10-CM

## 2018-01-13 LAB — POCT INFLUENZA A/B
Influenza A, POC: NEGATIVE
Influenza B, POC: NEGATIVE

## 2018-01-13 MED ORDER — DOXYCYCLINE HYCLATE 100 MG PO TABS: 100.0000 mg | ORAL_TABLET | Freq: Two times a day (BID) | ORAL | 0 refills | Status: DC

## 2018-01-13 MED ORDER — GUAIFENESIN-CODEINE 100-10 MG/5ML PO SOLN: 10.0000 mL | Freq: Three times a day (TID) | ORAL | 0 refills | Status: DC | PRN

## 2018-01-13 NOTE — Assessment & Plan Note (Signed)
Sounds like a flulike illness to start with the rapid flu test was negative.  Likely now has bronchitis versus contributing sinus infection.  Discussed chest x-ray though we decided to defer this and treat empirically with doxycycline.  If not improving in the next 2 days he will return for chest x-ray.  If he worsens or develops new symptoms will be reevaluated.  Provided with cough suppressant.  Advised this could make him drowsy.  Given return precautions.

## 2018-01-13 NOTE — Assessment & Plan Note (Signed)
Resolved.  No current symptoms.  Monitor for recurrence.

## 2018-01-13 NOTE — Progress Notes (Signed)
  Tommi Rumps, MD Phone: [phone]  Devon Miranda is a 47 y.o. male who presents today for follow-up.  Patient reports for the last week he has had upper respiratory and chest congestion.  He notes it started suddenly and he felt like he got hit by a truck.  He has had aches and congestion.  Sore throat.  Cough productive of yellow mucus. yellow/red mucus out of his nose.  Some chills initially.  He will feel better one day and then worse the next day.  Mostly chest congestion now.  Slight difficulty breathing at times.  No chest pain.  He has been using over-the-counter regimen.  Hypertension: Not checking at home.  Taking lisinopril/HCTZ.  No chest pain or shortness of breath prior to his current illness.  He is also been taking his cholesterol medication.  Patient was previously seen for an abscess on his chest.  Treated with antibiotics and then had it drained by general surgery.  He notes it is significantly improved and he has had no issues with that recently.    Social History   Tobacco Use  Smoking Status Never Smoker  Smokeless Tobacco Never Used     ROS see history of present illness  Objective  Physical Exam Vitals:   01/13/18 1323  BP: 140/90  Pulse: 77  Temp: 98.1 F (36.7 C)  SpO2: 95%    BP Readings from Last 3 Encounters:  01/13/18 140/90  11/19/17 130/74  11/18/17 136/76   Wt Readings from Last 3 Encounters:  01/13/18 (!) 326 lb 6.4 oz (148.1 kg)  11/19/17 (!) 330 lb (149.7 kg)  11/18/17 (!) 333 lb 9.6 oz (151.3 kg)    Physical Exam  Constitutional: No distress.  Cardiovascular: Normal rate, regular rhythm and normal heart sounds.  Pulmonary/Chest: Effort normal and breath sounds normal.    Musculoskeletal: He exhibits no edema.  Neurological: He is alert. Gait normal.  Skin: Skin is warm and dry. He is not diaphoretic.     Assessment/Plan: Please see individual problem list.  Hypertension I suspect it is borderline elevated  related to his illness.  He will continue to monitor at home.  Abscess of chest wall Resolved.  No current symptoms.  Monitor for recurrence.  Bronchitis Sounds like a flulike illness to start with the rapid flu test was negative.  Likely now has bronchitis versus contributing sinus infection.  Discussed chest x-ray though we decided to defer this and treat empirically with doxycycline.  If not improving in the next 2 days he will return for chest x-ray.  If he worsens or develops new symptoms will be reevaluated.  Provided with cough suppressant.  Advised this could make him drowsy.  Given return precautions.   Orders Placed This Encounter  Procedures  . Comp Met (CMET)    Standing Status:   Future    Standing Expiration Date:   01/13/2019  . POCT Influenza A/B    Meds ordered this encounter  Medications  . doxycycline (VIBRA-TABS) 100 MG tablet    Sig: Take 1 tablet (100 mg total) by mouth 2 (two) times daily.    Dispense:  14 tablet    Refill:  0  . guaiFENesin-codeine 100-10 MG/5ML syrup    Sig: Take 10 mLs by mouth 3 (three) times daily as needed for cough.    Dispense:  120 mL    Refill:  0     Tommi Rumps, MD [place]

## 2018-01-13 NOTE — Patient Instructions (Signed)
Nice to see you. We will treat you with an antibiotic to cover for bronchitis and sinus infection. We will treat your cough with codeine.  This may make you drowsy. If you develop trouble breathing, cough productive of blood, or worsening symptoms or your symptoms do not improve please be reevaluated.  If your symptoms do not improve over the next 2 days please contact us.

## 2018-01-13 NOTE — Assessment & Plan Note (Signed)
I suspect it is borderline elevated related to his illness.  He will continue to monitor at home.

## 2018-01-23 ENCOUNTER — Telehealth: Payer: Self-pay | Admitting: Family Medicine

## 2018-01-23 DIAGNOSIS — J40 Bronchitis, not specified as acute or chronic: Secondary | ICD-10-CM

## 2018-01-23 NOTE — Telephone Encounter (Signed)
Please advise 

## 2018-01-23 NOTE — Telephone Encounter (Signed)
Copied from [place]. Topic: General - Other >> Jan 23, 2018  1:30 PM Synthia Innocent wrote: Reason for CRM: Patient seen on 01/13/18, not getting any better, would like to move forward with chest xray. Thinks it is the flu, tested negative

## 2018-01-23 NOTE — Telephone Encounter (Signed)
CXR ordered. Please let the patient know to come in to complete this.

## 2018-01-24 ENCOUNTER — Encounter: Payer: Self-pay | Admitting: Family Medicine

## 2018-01-24 ENCOUNTER — Ambulatory Visit (INDEPENDENT_AMBULATORY_CARE_PROVIDER_SITE_OTHER): Payer: No Typology Code available for payment source

## 2018-01-24 DIAGNOSIS — J40 Bronchitis, not specified as acute or chronic: Secondary | ICD-10-CM | POA: Diagnosis not present

## 2018-01-24 NOTE — Telephone Encounter (Signed)
Patient notified

## 2018-01-26 ENCOUNTER — Encounter: Payer: Self-pay | Admitting: Family Medicine

## 2018-01-27 ENCOUNTER — Telehealth: Payer: Self-pay

## 2018-01-27 ENCOUNTER — Ambulatory Visit (INDEPENDENT_AMBULATORY_CARE_PROVIDER_SITE_OTHER): Payer: Self-pay | Admitting: Nurse Practitioner

## 2018-01-27 VITALS — BP 136/88 | HR 72 | Temp 98.3°F | Wt 320.0 lb

## 2018-01-27 DIAGNOSIS — B9689 Other specified bacterial agents as the cause of diseases classified elsewhere: Secondary | ICD-10-CM

## 2018-01-27 DIAGNOSIS — J069 Acute upper respiratory infection, unspecified: Secondary | ICD-10-CM

## 2018-01-27 DIAGNOSIS — R059 Cough, unspecified: Secondary | ICD-10-CM

## 2018-01-27 DIAGNOSIS — R05 Cough: Secondary | ICD-10-CM

## 2018-01-27 MED ORDER — PREDNISONE 10 MG (21) PO TBPK: ORAL_TABLET | ORAL | 0 refills | Status: AC

## 2018-01-27 MED ORDER — ALBUTEROL SULFATE HFA 108 (90 BASE) MCG/ACT IN AERS: 2.0000 | INHALATION_SPRAY | Freq: Four times a day (QID) | RESPIRATORY_TRACT | 0 refills | Status: DC | PRN

## 2018-01-27 MED ORDER — LEVOFLOXACIN 750 MG PO TABS: 750.0000 mg | ORAL_TABLET | Freq: Every day | ORAL | 0 refills | Status: AC

## 2018-01-27 MED ORDER — DEXTROMETHORPHAN HBR 15 MG/5ML PO SYRP: 10.0000 mL | ORAL_SOLUTION | Freq: Four times a day (QID) | ORAL | 0 refills | Status: AC | PRN

## 2018-01-27 NOTE — Telephone Encounter (Signed)
Copied from [place]. Topic: General - Other >> Jan 27, 2018  1:15 PM Cecelia Byars, NT wrote: Reason for CRM: Patients wife called and said he is going to the urgent care today ,

## 2018-01-27 NOTE — Patient Instructions (Addendum)
Upper Respiratory Infection, Adult Most upper respiratory infections (URIs) are a viral infection of the air passages leading to the lungs. A URI affects the nose, throat, and upper air passages. The most common type of URI is nasopharyngitis and is typically referred to as "the common cold." URIs run their course and usually go away on their own. Most of the time, a URI does not require medical attention, but sometimes a bacterial infection in the upper airways can follow a viral infection. This is called a secondary infection. Sinus and middle ear infections are common types of secondary upper respiratory infections. Bacterial pneumonia can also complicate a URI. A URI can worsen asthma and chronic obstructive pulmonary disease (COPD). Sometimes, these complications can require emergency medical care and may be life threatening. What are the causes? Almost all URIs are caused by viruses. A virus is a type of germ and can spread from one person to another. What increases the risk? You may be at risk for a URI if:  You smoke.  You have chronic heart or lung disease.  You have a weakened defense (immune) system.  You are very young or very old.  You have nasal allergies or asthma.  You work in crowded or poorly ventilated areas.  You work in health care facilities or schools.  What are the signs or symptoms? Symptoms typically develop 2-3 days after you come in contact with a cold virus. Most viral URIs last 7-10 days. However, viral URIs from the influenza virus (flu virus) can last 14-18 days and are typically more severe. Symptoms may include:  Runny or stuffy (congested) nose.  Sneezing.  Cough.  Sore throat.  Headache.  Fatigue.  Fever.  Loss of appetite.  Pain in your forehead, behind your eyes, and over your cheekbones (sinus pain).  Muscle aches.  How is this diagnosed? Your health care provider may diagnose a URI by:  Physical exam.  Tests to check that your  symptoms are not due to another condition such as: ? Strep throat. ? Sinusitis. ? Pneumonia. ? Asthma.  How is this treated? A URI goes away on its own with time. It cannot be cured with medicines, but medicines may be prescribed or recommended to relieve symptoms. Medicines may help:  Reduce your fever.  Reduce your cough.  Relieve nasal congestion.  Follow these instructions at home:  Take medicines only as directed by your health care provider.  Gargle warm saltwater or take cough drops to comfort your throat as directed by your health care provider.  Use a warm mist humidifier or inhale steam from a shower to increase air moisture. This may make it easier to breathe.  Drink enough fluid to keep your urine clear or pale yellow.  Eat soups and other clear broths and maintain good nutrition.  Rest as needed.  Return to work when your temperature has returned to normal or as your health care provider advises. You may need to stay home longer to avoid infecting others. You can also use a face mask and careful hand washing to prevent spread of the virus.  Increase the usage of your inhaler if you have asthma.  Do not use any tobacco products, including cigarettes, chewing tobacco, or electronic cigarettes. If you need help quitting, ask your health care provider. How is this prevented? The best way to protect yourself from getting a cold is to practice good hygiene.  Avoid oral or hand contact with people with cold symptoms.  Wash your   hands often if contact occurs.  There is no clear evidence that vitamin C, vitamin E, echinacea, or exercise reduces the chance of developing a cold. However, it is always recommended to get plenty of rest, exercise, and practice good nutrition. Contact a health care provider if:  You are getting worse rather than better.  Your symptoms are not controlled by medicine.  You have chills.  You have worsening shortness of breath.  You have  brown or red mucus.  You have yellow or brown nasal discharge.  You have pain in your face, especially when you bend forward.  You have a fever.  You have swollen neck glands.  You have pain while swallowing.  You have white areas in the back of your throat. Get help right away if:  You have severe or persistent: ? Headache. ? Ear pain. ? Sinus pain. ? Chest pain.  You have chronic lung disease and any of the following: ? Wheezing. ? Prolonged cough. ? Coughing up blood. ? A change in your usual mucus.  You have a stiff neck.  You have changes in your: ? Vision. ? Hearing. ? Thinking. ? Mood. This information is not intended to replace advice given to you by your health care provider. Make sure you discuss any questions you have with your health care provider. Document Released: 05/29/2001 Document Revised: 08/05/2016 Document Reviewed: 03/10/2014 Elsevier Interactive Patient Education  2018 [place].  Cough, Adult Coughing is a reflex that clears your throat and your airways. Coughing helps to heal and protect your lungs. It is normal to cough occasionally, but a cough that happens with other symptoms or lasts a long time may be a sign of a condition that needs treatment. A cough may last only 2-3 weeks (acute), or it may last longer than 8 weeks (chronic). What are the causes? Coughing is commonly caused by:  Breathing in substances that irritate your lungs.  A viral or bacterial respiratory infection.  Allergies.  Asthma.  Postnasal drip.  Smoking.  Acid backing up from the stomach into the esophagus (gastroesophageal reflux).  Certain medicines.  Chronic lung problems, including COPD (or rarely, lung cancer).  Other medical conditions such as heart failure.  Follow these instructions at home: Pay attention to any changes in your symptoms. Take these actions to help with your discomfort:  Take medicines only as told by your health care  provider. ? If you were prescribed an antibiotic medicine, take it as told by your health care provider. Do not stop taking the antibiotic even if you start to feel better. ? Talk with your health care provider before you take a cough suppressant medicine.  Drink enough fluid to keep your urine clear or pale yellow.  If the air is dry, use a cold steam vaporizer or humidifier in your bedroom or your home to help loosen secretions.  Avoid anything that causes you to cough at work or at home.  If your cough is worse at night, try sleeping in a semi-upright position.  Avoid cigarette smoke. If you smoke, quit smoking. If you need help quitting, ask your health care provider.  Avoid caffeine.  Avoid alcohol.  Rest as needed.  Contact a health care provider if:  You have new symptoms.  You cough up pus.  Your cough does not get better after 2-3 weeks, or your cough gets worse.  You cannot control your cough with suppressant medicines and you are losing sleep.  You develop pain that is  getting worse or pain that is not controlled with pain medicines.  You have a fever.  You have unexplained weight loss.  You have night sweats. Get help right away if:  You cough up blood.  You have difficulty breathing.  Your heartbeat is very fast. This information is not intended to replace advice given to you by your health care provider. Make sure you discuss any questions you have with your health care provider. Document Released: 06/01/2011 Document Revised: 05/10/2016 Document Reviewed: 02/09/2015 Elsevier Interactive Patient Education  [employer].

## 2018-01-27 NOTE — Progress Notes (Signed)
Subjective:  Jaycen Vercher is a 47 y.o. male who presents for evaluation of URI like symptoms.  Symptoms include bilateral ear pressure/pain, congestion, cough described as nonproductive and but loose, nasal congestion, no fever, post nasal drip, shortness of breath and wheezing.  Onset of symptoms was 4 weeks ago, and has been gradually worsening since that time.  Treatment to date:  antibiotics, cough suppressants, decongestants and ibuprofen.  High risk factors for influenza complications:  co-morbid illness.  Patient has been seen by his PCP over the past month.  Patient had a chest x-ray last week and dxed with bronchitis, patient states no medications were given.   The following portions of the patient's history were reviewed and updated as appropriate:  allergies, current medications and past medical history.  Constitutional: positive for fatigue and malaise, negative for anorexia, chills and fevers Eyes: negative Ears, nose, mouth, throat, and face: positive for hoarseness, nasal congestion, sore throat and bilateral ear pressure/fullness, negative for ear drainage and earaches Respiratory: positive for cough, dyspnea on exertion, sputum and wheezing, negative for emphysema, hemoptysis and pleurisy/chest pain Cardiovascular: negative Gastrointestinal: negative Neurological: positive for headaches, negative for coordination problems, dizziness, paresthesia, tremors, vertigo and weakness   Objective:  BP 136/88   Pulse 72   Temp 98.3 F (36.8 C)   Wt (!) 320 lb (145.2 kg)   SpO2 97%   BMI 43.40 kg/m  General appearance: alert, cooperative, fatigued and mild distress Head: Normocephalic, without obvious abnormality, atraumatic Eyes: conjunctivae/corneas clear. PERRL, EOM's intact. Fundi benign. Ears: normal TM's and external ear canals both ears Nose: turbinates swollen, inflamed, no sinus tenderness Throat: lips, mucosa, and tongue normal; teeth and gums normal Lungs: rhonchi  anterior - bilateral and posterior - bilateral and wheezes posterior - right Heart: regular rate and rhythm, S1, S2 normal, no murmur, click, rub or gallop Abdomen: soft, non-tender; bowel sounds normal; no masses,  no organomegaly Pulses: 2+ and symmetric Skin: Skin color, texture, turgor normal. No rashes or lesions Lymph nodes: cervical and submandibular nodes normal Neurologic: Grossly normal    Assessment:  Bacterial Upper Respiratory Infection and Cough    Plan:  Discussed diagnosis and treatment of URI. Educational material distributed and questions answered. Suggested symptomatic OTC remedies. Supportive care with appropriate antipyretics and fluids. Levaquin, Albuterol, Steraped Dose Pack and Tussin Cough syrup.  Patient encouraged to increase fluids, use humidifier, sleep elevated on two pillow until symptoms improve.  Go to ER if worsening SOB, difficulty breathing, etc.  Follow up in 2 days to update provider on condition. per orders. Follow up with PCP in 2 days or as needed.

## 2018-01-27 NOTE — Telephone Encounter (Signed)
Noted.  Thanks.  Please follow-up with the patient to make sure he got evaluated.

## 2018-01-27 NOTE — Telephone Encounter (Signed)
fyi

## 2018-01-28 NOTE — Telephone Encounter (Signed)
Left message to return call, ok for pec to speak to patient and ask him how he is feeling and if he went to be evaluated.

## 2018-01-29 ENCOUNTER — Telehealth: Payer: Self-pay

## 2018-01-29 NOTE — Telephone Encounter (Signed)
Follow up call for visit on 01/27/2018. Patient stated that he is doing better little wheezing and cough but states that he believes medication is working for him.

## 2018-01-30 NOTE — Telephone Encounter (Signed)
Patient went to [facility], notes are in chart

## 2018-02-26 ENCOUNTER — Telehealth: Payer: Self-pay

## 2018-02-26 NOTE — Telephone Encounter (Signed)
Please advise 

## 2018-02-26 NOTE — Telephone Encounter (Signed)
Unfortunately I do not know any chiropractors in the area.

## 2018-02-26 NOTE — Telephone Encounter (Signed)
Copied from [place] [id number]. Topic: Referral - Request >> Feb 26, 2018  2:27 PM Vernona Rieger wrote: Reason for CRM: Patient is requesting a chiropractor for back pain. Call back is [phone]

## 2018-02-27 NOTE — Telephone Encounter (Signed)
Patient notified, he states he has the focus plan and needs a referral. Informed patient there is an app with this plan were the patient can place their own referral for something like this since he is already seeing a chiropractor

## 2018-05-07 ENCOUNTER — Encounter: Payer: Self-pay | Admitting: Family

## 2018-05-07 ENCOUNTER — Ambulatory Visit (INDEPENDENT_AMBULATORY_CARE_PROVIDER_SITE_OTHER): Payer: Self-pay | Admitting: Family

## 2018-05-07 VITALS — BP 150/90 | HR 95 | Temp 98.8°F | Wt 334.0 lb

## 2018-05-07 DIAGNOSIS — B9789 Other viral agents as the cause of diseases classified elsewhere: Secondary | ICD-10-CM

## 2018-05-07 DIAGNOSIS — J019 Acute sinusitis, unspecified: Secondary | ICD-10-CM

## 2018-05-07 MED ORDER — PREDNISONE 20 MG PO TABS
60.0000 mg | ORAL_TABLET | Freq: Every day | ORAL | 0 refills | Status: DC
Start: 2018-05-07 — End: 2018-05-19

## 2018-05-07 NOTE — Patient Instructions (Signed)

## 2018-05-07 NOTE — Progress Notes (Signed)
Subjective:     Patient ID: Nickson Middlesworth, male   DOB: Sep 25, 1971, 47 y.o.   MRN: [id number]  HPI 47 year old male is in today with c/o sinus pressure, headache, fatigue x 3 days. Has not been getting much sleep. No cough. Not taking any medication.   Review of Systems  Constitutional: Positive for fatigue. Negative for chills and fever.  HENT: Positive for congestion, sinus pressure and sinus pain.   Respiratory: Negative.   Cardiovascular: Negative.   Skin: Negative.   Allergic/Immunologic: Positive for environmental allergies. Negative for food allergies.  Neurological: Negative.   Hematological: Negative.   Psychiatric/Behavioral: Negative.    Past Medical History:  Diagnosis Date  . Essential hypertension, benign   . Near syncope 04/24/2012  . Other and unspecified hyperlipidemia   . Other testicular hypofunction   . PVC (premature ventricular contraction) 11/06/2013  . Unspecified essential hypertension     Social History   Socioeconomic History  . Marital status: Married    Spouse name: Not on file  . Number of children: Not on file  . Years of education: Not on file  . Highest education level: Not on file  Occupational History  . Not on file  Social Needs  . Financial resource strain: Not on file  . Food insecurity:    Worry: Not on file    Inability: Not on file  . Transportation needs:    Medical: Not on file    Non-medical: Not on file  Tobacco Use  . Smoking status: Never Smoker  . Smokeless tobacco: Never Used  Substance and Sexual Activity  . Alcohol use: Yes    Comment: occassionally  . Drug use: No  . Sexual activity: Not on file  Lifestyle  . Physical activity:    Days per week: Not on file    Minutes per session: Not on file  . Stress: Not on file  Relationships  . Social connections:    Talks on phone: Not on file    Gets together: Not on file    Attends religious service: Not on file    Active member of club or organization: Not on  file    Attends meetings of clubs or organizations: Not on file    Relationship status: Not on file  . Intimate partner violence:    Fear of current or ex partner: Not on file    Emotionally abused: Not on file    Physically abused: Not on file    Forced sexual activity: Not on file  Other Topics Concern  . Not on file  Social History Narrative  . Not on file    Past Surgical History:  Procedure Laterality Date  . arthroscopic   1991   left knee surgery  . [place]    Family History  Problem Relation Age of Onset  . Hyperlipidemia Mother   . Hypertension Mother   . Arthritis Mother   . Stroke Father   . Hyperlipidemia Father   . Hypertension Father   . Alcohol abuse Father   . Arthritis Father   . Colon cancer Maternal Grandfather   . Prostate cancer Neg Hx   . Bladder Cancer Neg Hx   . Kidney cancer Neg Hx     No Known Allergies  Current Outpatient Medications on File Prior to Visit  Medication Sig Dispense Refill  . Coenzyme Q10 (CO Q 10) 100 MG CAPS Take by mouth daily.    Marland Kitchen  fenofibrate 160 MG tablet Take 160 mg by mouth daily.    Marland Kitchen lisinopril-hydrochlorothiazide (PRINZIDE,ZESTORETIC) 20-12.5 MG tablet Take by mouth daily.    . Multiple Vitamin (MULTIVITAMIN) capsule Take 1 capsule daily by mouth.    . Omega-3 Fatty Acids (FISH OIL) 1200 MG CPDR Take by mouth. Takes 4 tablets daily.    Marland Kitchen albuterol (PROVENTIL HFA) 108 (90 Base) MCG/ACT inhaler Inhale 2 puffs into the lungs every 6 (six) hours as needed for up to 5 days for wheezing or shortness of breath. 1 Inhaler 0  . doxycycline (VIBRA-TABS) 100 MG tablet Take 1 tablet (100 mg total) by mouth 2 (two) times daily. (Patient not taking: Reported on 01/27/2018) 14 tablet 0  . guaiFENesin-codeine 100-10 MG/5ML syrup Take 10 mLs by mouth 3 (three) times daily as needed for cough. (Patient not taking: Reported on 05/07/2018) 120 mL 0   No current facility-administered  medications on file prior to visit.     BP (!) 150/90   Pulse 95   Temp 98.8 F (37.1 C)   Wt (!) 334 lb (151.5 kg)   SpO2 97%   BMI 45.30 kg/m chart    Objective:   Physical Exam  Constitutional: He is oriented to person, place, and time. He appears well-developed and well-nourished.  HENT:  Right Ear: External ear normal.  Left Ear: External ear normal.  Nose: Sinus tenderness present. Right sinus exhibits maxillary sinus tenderness. Left sinus exhibits maxillary sinus tenderness.  Mouth/Throat: Oropharynx is clear and moist.  Eyes: Conjunctivae are normal.  Cardiovascular: Normal rate, regular rhythm and normal heart sounds.  Pulmonary/Chest: Effort normal and breath sounds normal.  Musculoskeletal: Normal range of motion.  Neurological: He is alert and oriented to person, place, and time.  Skin: Skin is warm.       Assessment:     Keshaun was seen today for focus-sorethroat/sinus issue.  Diagnoses and all orders for this visit:  Acute viral sinusitis  Other orders -     predniSONE (DELTASONE) 20 MG tablet; Take 3 tablets (60 mg total) by mouth daily with breakfast.      Plan:     Call with any questions or concerns. Recheck as needed if symptoms worsen or persist.

## 2018-05-09 ENCOUNTER — Telehealth: Payer: Self-pay | Admitting: Emergency Medicine

## 2018-05-09 NOTE — Telephone Encounter (Signed)
Left message follow up visit with [facility]  

## 2018-05-13 ENCOUNTER — Encounter: Payer: Self-pay | Admitting: Family Medicine

## 2018-05-13 DIAGNOSIS — R0681 Apnea, not elsewhere classified: Secondary | ICD-10-CM

## 2018-05-15 ENCOUNTER — Telehealth: Payer: Self-pay

## 2018-05-15 NOTE — Telephone Encounter (Signed)
Copied from [place] [id number]. Topic: Appointment Scheduling - Scheduling Inquiry for Clinic >> May 15, 2018 11:06 AM Percell Belt A wrote: Reason for CRM:  Pt wife called in and stated that pt needs to get in for CPE with Dr Caryl Bis before 06/16/2018. Please advise   Best number -[phone]

## 2018-05-15 NOTE — Telephone Encounter (Signed)
scheduled

## 2018-05-19 ENCOUNTER — Encounter: Payer: Self-pay | Admitting: Internal Medicine

## 2018-05-19 ENCOUNTER — Ambulatory Visit (INDEPENDENT_AMBULATORY_CARE_PROVIDER_SITE_OTHER): Payer: No Typology Code available for payment source | Admitting: Internal Medicine

## 2018-05-19 VITALS — BP 128/80 | HR 69 | Ht 73.0 in | Wt 326.2 lb

## 2018-05-19 DIAGNOSIS — G4719 Other hypersomnia: Secondary | ICD-10-CM | POA: Diagnosis not present

## 2018-05-19 MED ORDER — ALBUTEROL SULFATE 108 (90 BASE) MCG/ACT IN AEPB: 1.0000 | INHALATION_SPRAY | Freq: Four times a day (QID) | RESPIRATORY_TRACT | 5 refills | Status: AC | PRN

## 2018-05-19 NOTE — Progress Notes (Signed)
[facility] Consultation      Assessment and Plan:  Excessive daytime sleepiness. - Symptoms and signs of obstructive sleep apnea. - We will send for sleep study.  Will start on CPAP if indicated.  Dyspnea. - Symptoms appear atypical, occurring more with stress, and with sitting.  Does not appear to be brought on by activity. - Discussed that this may be some mild asthmatic symptoms, versus related to obesity and anxiety.  I have given him a prescription for pro-air take 1 to 2 puffs as needed for symptoms.  If this is not helpful then weight loss would be recommended.  Orders Placed This Encounter  Procedures  . Home sleep test   Meds ordered this encounter  Medications  . Albuterol Sulfate (PROAIR RESPICLICK) 676 (90 Base) MCG/ACT AEPB    Sig: Inhale 1-2 puffs into the lungs every 6 (six) hours as needed.    Dispense:  1 each    Refill:  5   Return in about 3 months (around 08/19/2018).   Date: 05/19/2018  MRN# [id number] Devon Miranda [birth year]    Devon Miranda is a 47 y.o. old male seen in consultation for chief complaint of:    Chief Complaint  Patient presents with  . Consult    Sleep-Dr. Caryl Bis for sleep apnea; attempted sleep study at age of 47 unable to do. Pt notes he snores (woke himself up) and feels tired through out the day    HPI:   The patient is a 47 year old male referred for symptoms of excessive daytime sleepiness.  Patient notes that he has headaches, also has trouble falling asleep.  He usually goes to bed between 11 PM and midnight.  He wakes up a few times to go to the bathroom.  He gets out of bed between 6 and 7:30 AM.  His Epworth score is elevated at 15. He notes that he has been woken himself up multiple during the night. He is very tired and exhausted during the day, usually by about 1 pm. His wife tells him that he snores and that he has stopped breathing in his sleep.  He feels that might have to doze off if he is  sitting still.  He denies jaw pain, no TMJ.   He also notes that he has occasional breathing. He can exercise without difficulty, but sometimes when he is sitting he feels that he can not get in a full breath. It typically occurs at rest or with stress and more when sitting than standing.    PMHX:   Past Medical History:  Diagnosis Date  . Essential hypertension, benign   . Near syncope 04/24/2012  . Other and unspecified hyperlipidemia   . Other testicular hypofunction   . PVC (premature ventricular contraction) 11/06/2013  . Unspecified essential hypertension    Surgical Hx:  Past Surgical History:  Procedure Laterality Date  . arthroscopic   1991   left knee surgery  . [place]   Family Hx:  Family History  Problem Relation Age of Onset  . Hyperlipidemia Mother   . Hypertension Mother   . Arthritis Mother   . Stroke Father   . Hyperlipidemia Father   . Hypertension Father   . Alcohol abuse Father   . Arthritis Father   . Colon cancer Maternal Grandfather   . Prostate cancer Neg Hx   . Bladder Cancer Neg Hx   . Kidney cancer Neg Hx  Social Hx:   Social History   Tobacco Use  . Smoking status: Never Smoker  . Smokeless tobacco: Never Used  Substance Use Topics  . Alcohol use: Yes    Comment: occassionally  . Drug use: No   Medication:    Current Outpatient Medications:  .  Coenzyme Q10 (CO Q 10) 100 MG CAPS, Take by mouth daily., Disp: , Rfl:  .  fenofibrate 160 MG tablet, Take 160 mg by mouth daily., Disp: , Rfl:  .  lisinopril-hydrochlorothiazide (PRINZIDE,ZESTORETIC) 20-12.5 MG tablet, Take by mouth daily., Disp: , Rfl:  .  Multiple Vitamin (MULTIVITAMIN) capsule, Take 1 capsule daily by mouth., Disp: , Rfl:  .  Omega-3 Fatty Acids (FISH OIL) 1200 MG CPDR, Take by mouth. Takes 4 tablets daily., Disp: , Rfl:  .  albuterol (PROVENTIL HFA) 108 (90 Base) MCG/ACT inhaler, Inhale 2 puffs into the lungs every 6 (six)  hours as needed for up to 5 days for wheezing or shortness of breath. (Patient not taking: Reported on 05/19/2018), Disp: 1 Inhaler, Rfl: 0   Allergies:  Patient has no known allergies.  Review of Systems: Gen:  Denies  fever, sweats, chills HEENT: Denies blurred vision, double vision. bleeds, sore throat Cvc:  No dizziness, chest pain. Resp:   Denies cough or sputum production, shortness of breath Gi: Denies swallowing difficulty, stomach pain. Gu:  Denies bladder incontinence, burning urine Ext:   No Joint pain, stiffness. Skin: No skin rash,  hives  Endoc:  No polyuria, polydipsia. Psych: No depression, insomnia. Other:  All other systems were reviewed with the patient and were negative other that what is mentioned in the HPI.   Physical Examination:   VS: BP 128/80 (BP Location: Right Arm, Cuff Size: Normal)   Pulse 69   Ht 6\' 1"  (1.854 m)   Wt (!) 326 lb 3.2 oz (148 kg)   SpO2 96%   BMI 43.04 kg/m   General Appearance: No distress  Neuro:without focal findings,  speech normal,  HEENT: PERRLA, EOM intact.  Mallampati 3 Pulmonary: normal breath sounds, No wheezing.  CardiovascularNormal S1,S2.  No m/r/g.   Abdomen: Benign, Soft, non-tender. Renal:  No costovertebral tenderness  GU:  No performed at this time. Endoc: No evident thyromegaly, no signs of acromegaly. Skin:   warm, no rashes, no ecchymosis  Extremities: normal, no cyanosis, clubbing.  Other findings:    LABORATORY PANEL:   CBC No results for input(s): WBC, HGB, HCT, PLT in the last 168 hours. ------------------------------------------------------------------------------------------------------------------  Chemistries  No results for input(s): NA, K, CL, CO2, GLUCOSE, BUN, CREATININE, CALCIUM, MG, AST, ALT, ALKPHOS, BILITOT in the last 168 hours.  Invalid input(s): GFRCGP ------------------------------------------------------------------------------------------------------------------  Cardiac  Enzymes No results for input(s): TROPONINI in the last 168 hours. ------------------------------------------------------------  RADIOLOGY:  No results found.     Thank  you for the consultation and for allowing [facility] to assist in the care of your patient. Our recommendations are noted above.  Please contact us if we can be of further service.   Marda Stalker, MD.  Board Certified in Internal Medicine, Pulmonary Medicine, [place], and Sleep Medicine.  [facility] Office Number: [phone]  Patricia Pesa, M.D.  Merton Border, M.D  05/19/2018

## 2018-05-19 NOTE — Patient Instructions (Signed)
Will send for sleep study.   Will prescribe albuterol inhaler, use it as needed to see if your breathing improves.     Sleep Apnea      Sleep apnea is disorder that affects a person's sleep. A person with sleep apnea has abnormal pauses in their breathing when they sleep. It is hard for them to get a good sleep. This makes a person tired during the day. It also can lead to other physical problems. There are three types of sleep apnea. One type is when breathing stops for a short time because your airway is blocked (obstructive sleep apnea). Another type is when the brain sometimes fails to give the normal signal to breathe to the muscles that control your breathing (central sleep apnea). The third type is a combination of the other two types. HOME CARE   Take all medicine as told by your doctor.  Avoid alcohol, calming medicines (sedatives), and depressant drugs.  Try to lose weight if you are overweight. Talk to your doctor about a healthy weight goal.  Your doctor may have you use a device that helps to open your airway. It can help you get the air that you need. It is called a positive airway pressure (PAP) device.   MAKE SURE YOU:   Understand these instructions.  Will watch your condition.  Will get help right away if you are not doing well or get worse.  It may take approximately 1 month for you to get used to wearing her CPAP every night.  Be sure to work with your machine to get used to it, be patient, it may take time!

## 2018-06-06 ENCOUNTER — Ambulatory Visit (INDEPENDENT_AMBULATORY_CARE_PROVIDER_SITE_OTHER): Payer: No Typology Code available for payment source | Admitting: Family Medicine

## 2018-06-06 ENCOUNTER — Encounter: Payer: Self-pay | Admitting: Family Medicine

## 2018-06-06 VITALS — BP 116/78 | HR 63 | Temp 97.9°F | Ht 72.0 in | Wt 331.6 lb

## 2018-06-06 DIAGNOSIS — R0600 Dyspnea, unspecified: Secondary | ICD-10-CM | POA: Diagnosis not present

## 2018-06-06 DIAGNOSIS — Z114 Encounter for screening for human immunodeficiency virus [HIV]: Secondary | ICD-10-CM | POA: Diagnosis not present

## 2018-06-06 DIAGNOSIS — E781 Pure hyperglyceridemia: Secondary | ICD-10-CM | POA: Diagnosis not present

## 2018-06-06 DIAGNOSIS — Z0001 Encounter for general adult medical examination with abnormal findings: Secondary | ICD-10-CM | POA: Diagnosis not present

## 2018-06-06 LAB — COMPREHENSIVE METABOLIC PANEL
ALT: 17 U/L (ref 0–53)
AST: 16 U/L (ref 0–37)
Albumin: 4.1 g/dL (ref 3.5–5.2)
Alkaline Phosphatase: 43 U/L (ref 39–117)
BILIRUBIN TOTAL: 0.5 mg/dL (ref 0.2–1.2)
BUN: 13 mg/dL (ref 6–23)
CALCIUM: 9.2 mg/dL (ref 8.4–10.5)
CHLORIDE: 103 meq/L (ref 96–112)
CO2: 29 meq/L (ref 19–32)
Creatinine, Ser: 0.88 mg/dL (ref 0.40–1.50)
GFR: 98.46 mL/min (ref >60.00)
Glucose, Bld: 115 mg/dL — ABNORMAL HIGH (ref 70–99)
Potassium: 3.4 mEq/L — ABNORMAL LOW (ref 3.5–5.1)
Sodium: 139 mEq/L (ref 135–145)
Total Protein: 7 g/dL (ref 6.0–8.3)

## 2018-06-06 LAB — LIPID PANEL
CHOL/HDL RATIO: 5
Cholesterol: 186 mg/dL (ref 0–200)
HDL: 36.6 mg/dL — ABNORMAL LOW (ref >39.00)
Triglycerides: 404 mg/dL — ABNORMAL HIGH (ref 0.0–149.0)

## 2018-06-06 LAB — LDL CHOLESTEROL, DIRECT: Direct LDL: 88 mg/dL

## 2018-06-06 LAB — HEMOGLOBIN A1C: Hgb A1c MFr Bld: 5.1 % (ref 4.6–6.5)

## 2018-06-06 NOTE — Progress Notes (Signed)
Devon Rumps, MD Phone: [phone]  Devon Miranda is a 47 y.o. male who presents today for cpe.  Exercises by playing laser tag about 5 times a week for an hour.  He burns 700 cal when he does this. He is eating significantly healthier.  He is cut carbs.  Also decrease soda. No prior HIV screening. Reports tetanus vaccination sometime around 5 years ago. History of colon cancer in his grandfather.  No first-degree relatives with history of colon cancer.  No family history of prostate cancer. No tobacco use or illicit drug use.  Rare alcohol use. He sees a dentist 4 times a year.  Ophthalmologist once a year.  Patient saw pulmonology for hypersomnia.  Also noted a little bit of shortness of breath around that time.  Diagnosed with possible asthma.  Shortness of breath only occurs when he sitting there.  Never occurs with exertion.  This has improved quite a bit since he was evaluated by pulmonology.  He has not used albuterol.  Active Ambulatory Problems    Diagnosis Date Noted  . Dyspnea 04/24/2012  . Near syncope 04/24/2012  . Hypertension 04/24/2012  . Bradycardia 09/21/2013  . PVC (premature ventricular contraction) 11/06/2013  . Anxiety 11/06/2013  . Lightheadedness 10/07/2017  . Decreased hearing of left ear 10/07/2017  . Hypertriglyceridemia 10/07/2017  . History of hypoglycemia 10/07/2017  . Pain in right testicle 10/23/2017  . OSA (obstructive sleep apnea) 10/23/2017  . Scrotal pain 10/29/2017  . Bronchitis 01/13/2018  . Encounter for general adult medical examination with abnormal findings 06/06/2018   Resolved Ambulatory Problems    Diagnosis Date Noted  . Chest pain at rest 04/24/2012  . Hypoxia 09/21/2013  . Abscess of chest wall 11/19/2017   Past Medical History:  Diagnosis Date  . Essential hypertension, benign   . Near syncope 04/24/2012  . Other and unspecified hyperlipidemia   . Other testicular hypofunction   . PVC (premature ventricular  contraction) 11/06/2013  . Unspecified essential hypertension     Family History  Problem Relation Age of Onset  . Hyperlipidemia Mother   . Hypertension Mother   . Arthritis Mother   . Stroke Father   . Hyperlipidemia Father   . Hypertension Father   . Alcohol abuse Father   . Arthritis Father   . Colon cancer Maternal Grandfather   . Prostate cancer Neg Hx   . Bladder Cancer Neg Hx   . Kidney cancer Neg Hx     Social History   Socioeconomic History  . Marital status: Married    Spouse name: Not on file  . Number of children: Not on file  . Years of education: Not on file  . Highest education level: Not on file  Occupational History  . Not on file  Social Needs  . Financial resource strain: Not on file  . Food insecurity:    Worry: Not on file    Inability: Not on file  . Transportation needs:    Medical: Not on file    Non-medical: Not on file  Tobacco Use  . Smoking status: Never Smoker  . Smokeless tobacco: Never Used  Substance and Sexual Activity  . Alcohol use: Yes    Comment: occassionally  . Drug use: No  . Sexual activity: Not on file  Lifestyle  . Physical activity:    Days per week: Not on file    Minutes per session: Not on file  . Stress: Not on file  Relationships  . Social  connections:    Talks on phone: Not on file    Gets together: Not on file    Attends religious service: Not on file    Active member of club or organization: Not on file    Attends meetings of clubs or organizations: Not on file    Relationship status: Not on file  . Intimate partner violence:    Fear of current or ex partner: Not on file    Emotionally abused: Not on file    Physically abused: Not on file    Forced sexual activity: Not on file  Other Topics Concern  . Not on file  Social History Narrative  . Not on file    ROS  General:  Negative for nexplained weight loss, fever Skin: Negative for new or changing mole, sore that won't heal HEENT: Negative  for trouble hearing, trouble seeing, ringing in ears, mouth sores, hoarseness, change in voice, dysphagia. CV:  Negative for chest pain, edema, palpitations Resp: Positive for dyspnea, negative for cough, hemoptysis GI: Negative for nausea, vomiting, diarrhea, constipation, abdominal pain, melena, hematochezia. GU: Negative for dysuria, incontinence, urinary hesitance, hematuria, vaginal or penile discharge, polyuria, sexual difficulty, lumps in testicle or breasts MSK: Negative for muscle cramps or aches, joint pain or swelling Neuro: Negative for headaches, weakness, numbness, dizziness, passing out/fainting Psych: Negative for depression, anxiety, memory problems  Objective  Physical Exam Vitals:   06/06/18 1337  BP: 116/78  Pulse: 63  Temp: 97.9 F (36.6 C)  SpO2: 98%    BP Readings from Last 3 Encounters:  06/06/18 116/78  05/19/18 128/80  05/07/18 (!) 150/90   Wt Readings from Last 3 Encounters:  06/06/18 (!) 331 lb 9.6 oz (150.4 kg)  05/19/18 (!) 326 lb 3.2 oz (148 kg)  05/07/18 (!) 334 lb (151.5 kg)    Physical Exam  Constitutional: No distress.  HENT:  Head: Normocephalic and atraumatic.  Mouth/Throat: Oropharynx is clear and moist.  Eyes: Pupils are equal, round, and reactive to light. Conjunctivae are normal.  Neck: Neck supple.  Cardiovascular: Normal rate, regular rhythm and normal heart sounds.  Pulmonary/Chest: Effort normal and breath sounds normal.  Abdominal: Soft. Bowel sounds are normal. He exhibits no distension. There is no tenderness.  Musculoskeletal: He exhibits no edema.  Lymphadenopathy:    He has no cervical adenopathy.  Neurological: He is alert.  Skin: Skin is warm and dry. He is not diaphoretic.  Psychiatric: He has a normal mood and affect.     Assessment/Plan:   Encounter for general adult medical examination with abnormal findings Physical exam completed.  Labs will be completed.  Continue diet and exercise.  Dyspnea This is  improved since he saw pulmonology.  Possibly a measure of asthma versus related to his weight.  Given improvement he will monitor and if worsens let us or pulmonology know.   Orders Placed This Encounter  Procedures  . Lipid panel  . Comp Met (CMET)  . HgB A1c  . HIV antibody (with reflex)    No orders of the defined types were placed in this encounter.    Devon Rumps, MD [place]

## 2018-06-06 NOTE — Assessment & Plan Note (Signed)
This is improved since he saw pulmonology.  Possibly a measure of asthma versus related to his weight.  Given improvement he will monitor and if worsens let us or pulmonology know.

## 2018-06-06 NOTE — Patient Instructions (Signed)
Nice to see you. Please continue to stay active and work on dietary changes. Please monitor your breathing and if it worsens again please let us know. We will get lab work today and contact you with the results.

## 2018-06-06 NOTE — Assessment & Plan Note (Signed)
Physical exam completed.  Labs will be completed.  Continue diet and exercise.

## 2018-06-07 LAB — HIV ANTIBODY (ROUTINE TESTING W REFLEX): HIV: NONREACTIVE

## 2018-06-10 ENCOUNTER — Encounter: Payer: Self-pay | Admitting: Internal Medicine

## 2018-06-10 DIAGNOSIS — G4733 Obstructive sleep apnea (adult) (pediatric): Secondary | ICD-10-CM | POA: Diagnosis not present

## 2018-06-10 DIAGNOSIS — G4719 Other hypersomnia: Secondary | ICD-10-CM

## 2018-06-12 ENCOUNTER — Other Ambulatory Visit: Payer: Self-pay | Admitting: Family Medicine

## 2018-06-12 DIAGNOSIS — E876 Hypokalemia: Secondary | ICD-10-CM

## 2018-06-13 DIAGNOSIS — G4733 Obstructive sleep apnea (adult) (pediatric): Secondary | ICD-10-CM | POA: Diagnosis not present

## 2018-06-17 ENCOUNTER — Telehealth: Payer: Self-pay | Admitting: *Deleted

## 2018-06-17 DIAGNOSIS — G4733 Obstructive sleep apnea (adult) (pediatric): Secondary | ICD-10-CM

## 2018-06-17 NOTE — Telephone Encounter (Signed)
Pt aware of results of HST. Orders placed Nothing further needed.

## 2018-06-18 ENCOUNTER — Other Ambulatory Visit: Payer: Self-pay | Admitting: Internal Medicine

## 2018-06-18 DIAGNOSIS — G4733 Obstructive sleep apnea (adult) (pediatric): Secondary | ICD-10-CM

## 2018-07-14 ENCOUNTER — Other Ambulatory Visit (INDEPENDENT_AMBULATORY_CARE_PROVIDER_SITE_OTHER): Payer: No Typology Code available for payment source

## 2018-07-14 DIAGNOSIS — E876 Hypokalemia: Secondary | ICD-10-CM

## 2018-07-14 LAB — POTASSIUM: Potassium: 3.9 mEq/L (ref 3.5–5.1)

## 2018-07-28 ENCOUNTER — Encounter: Payer: Self-pay | Admitting: Family Medicine

## 2018-07-31 ENCOUNTER — Encounter: Payer: Self-pay | Admitting: Internal Medicine

## 2018-08-06 ENCOUNTER — Ambulatory Visit (INDEPENDENT_AMBULATORY_CARE_PROVIDER_SITE_OTHER): Payer: No Typology Code available for payment source | Admitting: Family Medicine

## 2018-08-06 ENCOUNTER — Encounter: Payer: Self-pay | Admitting: Family Medicine

## 2018-08-06 VITALS — BP 126/90 | HR 79 | Temp 98.2°F | Ht 72.0 in | Wt 333.8 lb

## 2018-08-06 DIAGNOSIS — N5082 Scrotal pain: Secondary | ICD-10-CM

## 2018-08-06 DIAGNOSIS — I10 Essential (primary) hypertension: Secondary | ICD-10-CM

## 2018-08-06 DIAGNOSIS — G8929 Other chronic pain: Secondary | ICD-10-CM

## 2018-08-06 DIAGNOSIS — M25561 Pain in right knee: Secondary | ICD-10-CM | POA: Diagnosis not present

## 2018-08-06 NOTE — Assessment & Plan Note (Signed)
Refer back to urology given intermittent discomfort.

## 2018-08-06 NOTE — Assessment & Plan Note (Signed)
Will refer to orthopedics.  Could be arthritic in nature versus meniscal.

## 2018-08-06 NOTE — Patient Instructions (Signed)
Nice to see you. Please continue to monitor your blood pressure. We will get you to see orthopedics and urology.

## 2018-08-06 NOTE — Assessment & Plan Note (Signed)
Improved on recheck.  Continue current regimen.

## 2018-08-06 NOTE — Progress Notes (Signed)
  Tommi Rumps, MD Phone: [phone]  Devon Miranda is a 47 y.o. male who presents today for f/u.  CC: right knee pain, htn, scrotal pain  Right knee pain: He has had chronic right knee pain though it has been worse over the last 3 or so weeks.  He notes no specific injury.  Notes it aches on the sides.  Grinds and pops.  Did lock on him at one point.  He is taking Aleve or ibuprofen.  He does not take them at the same time.  In the past he reports he saw orthopedics and was advised he had bone-on-bone arthritis in his right knee.  Hypertension: Has been running upper 130s over 80s.  No chest pain or shortness of breath.  He is taking his medication, though did just take his medication prior to coming into the office.  Scrotal pain: Patient previously evaluated for this by ultrasound and by urology.  He notes intermittently he will have some slight discomfort.  Social History   Tobacco Use  Smoking Status Never Smoker  Smokeless Tobacco Never Used     ROS see history of present illness  Objective  Physical Exam Vitals:   08/06/18 0914 08/06/18 0933  BP: (!) 152/88 126/90  Pulse: 79   Temp: 98.2 F (36.8 C)   SpO2: 98%     BP Readings from Last 3 Encounters:  08/06/18 126/90  06/06/18 116/78  05/19/18 128/80   Wt Readings from Last 3 Encounters:  08/06/18 (!) 333 lb 12.8 oz (151.4 kg)  06/06/18 (!) 331 lb 9.6 oz (150.4 kg)  05/19/18 (!) 326 lb 3.2 oz (148 kg)    Physical Exam  Constitutional: No distress.  Cardiovascular: Normal rate, regular rhythm and normal heart sounds.  Pulmonary/Chest: Effort normal and breath sounds normal.  Musculoskeletal: He exhibits no edema.  Right knee with no swelling, warmth, or erythema, there is no tenderness of the knee, there is pain on McMurray's testing on the right, left knee with no warmth, swelling, erythema, or tenderness, negative McMurray's  Neurological: He is alert.  Skin: Skin is warm and dry. He is not  diaphoretic.     Assessment/Plan: Please see individual problem list.  Scrotal pain Refer back to urology given intermittent discomfort.  Chronic pain of right knee Will refer to orthopedics.  Could be arthritic in nature versus meniscal.  Hypertension Improved on recheck.  Continue current regimen.   Orders Placed This Encounter  Procedures  . Ambulatory referral to Orthopedic Surgery    Referral Priority:   Routine    Referral Type:   Surgical    Referral Reason:   Specialty Services Required    Requested Specialty:   Orthopedic Surgery    Number of Visits Requested:   1  . Ambulatory referral to Urology    Referral Priority:   Routine    Referral Type:   Consultation    Referral Reason:   Specialty Services Required    Requested Specialty:   Urology    Number of Visits Requested:   1    No orders of the defined types were placed in this encounter.    Tommi Rumps, MD [place]

## 2018-08-07 ENCOUNTER — Ambulatory Visit: Payer: Self-pay | Admitting: Urology

## 2018-08-22 ENCOUNTER — Ambulatory Visit (INDEPENDENT_AMBULATORY_CARE_PROVIDER_SITE_OTHER): Payer: Self-pay

## 2018-08-22 ENCOUNTER — Ambulatory Visit (INDEPENDENT_AMBULATORY_CARE_PROVIDER_SITE_OTHER): Payer: No Typology Code available for payment source | Admitting: Physical Medicine and Rehabilitation

## 2018-08-22 ENCOUNTER — Encounter (INDEPENDENT_AMBULATORY_CARE_PROVIDER_SITE_OTHER): Payer: Self-pay | Admitting: Physical Medicine and Rehabilitation

## 2018-08-22 ENCOUNTER — Ambulatory Visit (INDEPENDENT_AMBULATORY_CARE_PROVIDER_SITE_OTHER): Payer: No Typology Code available for payment source

## 2018-08-22 DIAGNOSIS — M25562 Pain in left knee: Secondary | ICD-10-CM

## 2018-08-22 DIAGNOSIS — G8929 Other chronic pain: Secondary | ICD-10-CM

## 2018-08-22 DIAGNOSIS — M25561 Pain in right knee: Principal | ICD-10-CM

## 2018-08-22 DIAGNOSIS — M1712 Unilateral primary osteoarthritis, left knee: Secondary | ICD-10-CM | POA: Diagnosis not present

## 2018-08-22 NOTE — Progress Notes (Signed)
 .  Numeric Pain Rating Scale and Functional Assessment Average Pain 7   In the last MONTH (on 0-10 scale) has pain interfered with the following?  1. General activity like being  able to carry out your everyday physical activities such as walking, climbing stairs, carrying groceries, or moving a chair?  Rating(5)  -Dye Allergies.

## 2018-08-26 ENCOUNTER — Ambulatory Visit: Payer: Self-pay | Admitting: Urology

## 2018-09-10 ENCOUNTER — Encounter (INDEPENDENT_AMBULATORY_CARE_PROVIDER_SITE_OTHER): Payer: Self-pay | Admitting: Physical Medicine and Rehabilitation

## 2018-09-10 DIAGNOSIS — M25561 Pain in right knee: Secondary | ICD-10-CM | POA: Diagnosis not present

## 2018-09-10 DIAGNOSIS — G8929 Other chronic pain: Secondary | ICD-10-CM | POA: Diagnosis not present

## 2018-09-10 MED ORDER — TRIAMCINOLONE ACETONIDE 40 MG/ML IJ SUSP
40.0000 mg | INTRAMUSCULAR | Status: AC | PRN
  Administered 2018-09-10: 40 mg via INTRA_ARTICULAR

## 2018-09-10 MED ORDER — BUPIVACAINE HCL 0.25 % IJ SOLN
4.0000 mL | INTRAMUSCULAR | Status: AC | PRN
  Administered 2018-09-10: 4 mL via INTRA_ARTICULAR

## 2018-09-10 NOTE — Progress Notes (Signed)
Devon Miranda - 47 y.o. male MRN [id number]  Date of birth: [birth year]  Office Visit Note: Visit Date: 08/22/2018 PCP: Leone Haven, MD Referred by: Leone Haven, MD  Subjective: Chief Complaint  Patient presents with  . Right Knee - Pain  . Left Knee - Pain  Will get HPI: [name] is a 47 year old gentleman who comes in today at the request of Dr. Tommi Rumps at [facility] primary [facility] in [place].  [name] reports chronic history of bilateral knee pain with prior knee surgery on the left that was arthroscopic procedure.  This was in 1988 or 1989 after football injury.  He now complains of really more right-sided pain than left-sided pain.  His left knee is chronic and is used to the problems he has had with it.  He said issues on the left with clicking and locking at different times but nothing that really last for any length of time.  He has pain in the left knee this is chronic and ongoing but he is gotten used to it.  His right knee as of late is actually gotten worse.  He reports he gets pain with standing up after sitting.  He has noticed may be some clicking and mild locking on the right but nothing significant.  He is not really noted any swelling.  He has not had any specific injury or twisting motion.  No specific sport injury.  He denies any hip or groin pain.  He was seen by orthopedics in 2017 and was told that he had severe osteoarthritis on the left.  He was told he had arthritis on the right as well.  His insurance is changed and he is seeking care at a different facility.  He does try to stay active and does work out.  BMI is 45.3.  No other concerning medical issues but he does have obstructive sleep apnea.  He has had no focal weakness.  He has had no slipping of the knees or instability that is noted.  He has had no paresthesias or numbness or tingling.  He has had an injection in the past with relief.   Review of Systems  Constitutional: Negative for  chills, fever, malaise/fatigue and weight loss.  HENT: Negative for hearing loss and sinus pain.   Eyes: Negative for blurred vision, double vision and photophobia.  Respiratory: Negative for cough and shortness of breath.   Cardiovascular: Negative for chest pain, palpitations and leg swelling.  Gastrointestinal: Negative for abdominal pain, nausea and vomiting.  Genitourinary: Negative for flank pain.  Musculoskeletal: Positive for joint pain. Negative for myalgias.  Skin: Negative for itching and rash.  Neurological: Negative for tremors, focal weakness and weakness.  Endo/Heme/Allergies: Negative.   Psychiatric/Behavioral: Negative for depression.  All other systems reviewed and are negative.  Otherwise per HPI.  Assessment & Plan: Visit Diagnoses:  1. Chronic pain of right knee   2. Chronic pain of left knee   3. Unilateral primary osteoarthritis, left knee     Plan: Findings:  Chronic worsening bilateral knee pain with some mechanical complaints of locking on occasion.  Prior surgery on the left which was arthroscopic procedure remotely in 1988.  History of playing football and is a fairly large individual with BMI 45.3.  Prior evaluation by orthopedics in 2017 revealed arthritis of the left more than right knee.  X-rays today is very similar to that.  Exam does not show any concerning features although on the left he does  have positive McMurray's.  He has good stability otherwise.  Right knee did show some tenderness at the joint line but no real effusion noted on x-ray but some with palpation.  Because of his body habitus we did complete knee injection today with fluoroscopic guidance.  He did get decent relief during the anesthetic phase of the injection.  Depending on how he does and his course for the knee pain I will have him see Dr. Anderson Malta in our practice.  He may require more advanced imaging of the knees.  He will continue with current anti-inflammatory treatment.     Meds & Orders: No orders of the defined types were placed in this encounter.   Orders Placed This Encounter  Procedures  . Large Joint Inj  . XR KNEE 3 VIEW RIGHT  . XR C-ARM NO REPORT    Follow-up: Return if symptoms worsen or fail to improve.   Procedures: Large Joint Inj: R knee on 09/10/2018 5:53 AM Indications: pain and diagnostic evaluation Details: 22 G 3.5 in needle, fluoroscopy-guided anterolateral approach  Arthrogram: No  Medications: 40 mg triamcinolone acetonide 40 MG/ML; 4 mL bupivacaine 0.25 % Outcome: tolerated well, no immediate complications  There was excellent flow of contrast producing a partial arthrogram of the knee. The patient did have relief of symptoms during the anesthetic phase of the injection.  Consent was given by the patient. Immediately prior to procedure a time out was called to verify the correct patient, procedure, equipment, support staff and site/side marked as required. Patient was prepped and draped in the usual sterile fashion.      No notes on file   Clinical History: No specialty comments available.   He reports that he has never smoked. He has never used smokeless tobacco.  Recent Labs    06/06/18 1410  HGBA1C 5.1    Objective:  VS:  HT:    WT:   BMI:     BP:   HR: bpm  TEMP: ( )  RESP:  Physical Exam  Constitutional: He is oriented to person, place, and time. He appears well-developed and well-nourished. No distress.  HENT:  Head: Normocephalic and atraumatic.  Nose: Nose normal.  Mouth/Throat: Oropharynx is clear and moist.  Eyes: Pupils are equal, round, and reactive to light. Conjunctivae are normal.  Neck: Normal range of motion. Neck supple. No tracheal deviation present.  Cardiovascular: Regular rhythm and intact distal pulses.  Pulmonary/Chest: Effort normal and breath sounds normal.  Abdominal: Soft. He exhibits no distension. There is no rebound and no guarding.  Musculoskeletal: He exhibits no  deformity.  Examination shows no pain with hip rotation bilaterally.  Knees show good varus and valgus stability.  There is a negative Lockman's test bilaterally.  He has some crepitus and grinding a little bit on the left but not the right.  Mild palpable swelling of the right knee and some joint line tenderness on both knees.  No pain over the pes anserine bursa.  He has good range of motion 0-150 with flexion extension.  He has a positive McMurray's on the left with some clicking.  Neurological: He is alert and oriented to person, place, and time. He exhibits normal muscle tone. Coordination normal.  Skin: Skin is warm. No rash noted.  Psychiatric: He has a normal mood and affect. His behavior is normal.  Nursing note and vitals reviewed.   Ortho Exam Imaging: 3 view of the knees taken today shows: 3 view of the knee  shows left moderate to getting severe medial joint space narrowing with subchondral sclerosis.  Mild narrowing on the right with subchondral sclerosis.  No knee effusion or focal soft tissue abnormality. No radiopaque foreign bodies are identified.  Past Medical/Family/Surgical/Social History: Medications & Allergies reviewed per EMR, new medications updated. Patient Active Problem List   Diagnosis Date Noted  . Chronic pain of right knee 08/06/2018  . Encounter for general adult medical examination with abnormal findings 06/06/2018  . Scrotal pain 10/29/2017  . Pain in right testicle 10/23/2017  . OSA (obstructive sleep apnea) 10/23/2017  . Lightheadedness 10/07/2017  . Decreased hearing of left ear 10/07/2017  . Hypertriglyceridemia 10/07/2017  . History of hypoglycemia 10/07/2017  . PVC (premature ventricular contraction) 11/06/2013  . Anxiety 11/06/2013  . Bradycardia 09/21/2013  . Dyspnea 04/24/2012  . Near syncope 04/24/2012  . Hypertension 04/24/2012   Past Medical History:  Diagnosis Date  . Essential hypertension, benign   . Near syncope 04/24/2012  . Other  and unspecified hyperlipidemia   . Other testicular hypofunction   . PVC (premature ventricular contraction) 11/06/2013  . Unspecified essential hypertension    Family History  Problem Relation Age of Onset  . Hyperlipidemia Mother   . Hypertension Mother   . Arthritis Mother   . Stroke Father   . Hyperlipidemia Father   . Hypertension Father   . Alcohol abuse Father   . Arthritis Father   . Colon cancer Maternal Grandfather   . Prostate cancer Neg Hx   . Bladder Cancer Neg Hx   . Kidney cancer Neg Hx    Past Surgical History:  Procedure Laterality Date  . arthroscopic   1991   left knee surgery  . [place]   Social History   Occupational History  . Not on file  Tobacco Use  . Smoking status: Never Smoker  . Smokeless tobacco: Never Used  Substance and Sexual Activity  . Alcohol use: Yes    Comment: occassionally  . Drug use: No  . Sexual activity: Not on file

## 2018-09-11 ENCOUNTER — Encounter: Payer: Self-pay | Admitting: Internal Medicine

## 2018-09-11 NOTE — Progress Notes (Signed)
[facility] Consultation      Assessment and Plan:  Obstructive sleep apnea - Severe obstructive sleep apnea with AHI of 41. - Doing well with CPAP, to continue.   Dyspnea - Symptoms appear atypical, occurring more with stress, and with sitting.  Does not appear to be brought on by activity. - Continue albuterol as needed.   Obesity.  --has abdominal obesity which may be contributing to dyspbea.  --recommend weight loss.     Return in about 6 months (around 03/13/2019).   Date: 09/11/2018  MRN# [id number] Devon Miranda [birth year]    Devon Miranda is a 47 y.o. old male seen in consultation for chief complaint of:    Chief Complaint  Patient presents with  . Shortness of Breath    once every other week trouble catching breath at rest  . Sleep Apnea    CPAP working well, initial period of trying to catch breath    HPI:   The patient is a 47 year old male referred for symptoms of excessive daytime sleepiness. Sleep study showed severe OSA with AHI of 41. He has been started on CPAP, and feels that he is doing well with it, he I using it every night, and feels that he is more awake during the day. When he initially puts on the PAP he has trouble catching his breath.  He denies jaw pain, no TMJ.  He has episodes about every 2 weeks he has an episode of trouble breathing. No triggers, can occur at rest. He has been exercising every night and is not limited by dyspnea. At last visit we tried him on albuterol which he uses and feels that it helps. The problem goes away on its own after 10 minutes.   He also notes that he has occasional breathing. He can exercise without difficulty, but sometimes when he is sitting he feels that he can not get in a full breath. It typically occurs at rest or with stress and more when sitting than standing.   **CPAP download 08/05/2018-09/11/2018>> raw data personally reviewed, average usage on days used is 7 hours 5 minutes.   Usage greater than 4 hours is 28/30 days.  Pressure ranges 5-15.  Median pressure 10, 95th percentile pressure 12, maximum pressure 14.  Residual AHI is 1.6.  Overall this shows very good compliance with CPAP with excellent control of obstructive sleep apnea. **HST 06/10/2018>> severe obstructive sleep apnea with AHI of 41.  Recommended auto CPAP with pressure range 5-20.  Medication:    Current Outpatient Medications:  .  Albuterol Sulfate (PROAIR RESPICLICK) 774 (90 Base) MCG/ACT AEPB, Inhale 1-2 puffs into the lungs every 6 (six) hours as needed., Disp: 1 each, Rfl: 5 .  Coenzyme Q10 (CO Q 10) 100 MG CAPS, Take by mouth daily., Disp: , Rfl:  .  fenofibrate 160 MG tablet, Take 160 mg by mouth daily., Disp: , Rfl:  .  lisinopril-hydrochlorothiazide (PRINZIDE,ZESTORETIC) 20-12.5 MG tablet, Take by mouth daily., Disp: , Rfl:  .  Multiple Vitamin (MULTIVITAMIN) capsule, Take 1 capsule daily by mouth., Disp: , Rfl:  .  Omega-3 Fatty Acids (FISH OIL) 1200 MG CPDR, Take by mouth. Takes 4 tablets daily., Disp: , Rfl:    Allergies:  Patient has no known allergies.   Review of Systems:  Constitutional: Feels well. Cardiovascular: Denies chest pain, exertional chest pain.  Pulmonary: Denies hemoptysis, pleuritic chest pain.   The remainder of systems were reviewed and were found to be negative other than what  is documented in the HPI.    Physical Examination:   VS: BP 130/84 (BP Location: Left Arm, Patient Position: Sitting, Cuff Size: Normal)   Pulse 64   Ht 5' 10.75" (1.797 m)   Wt (!) 327 lb 9.6 oz (148.6 kg)   SpO2 98%   BMI 46.01 kg/m   General Appearance: No distress  Neuro:without focal findings, mental status, speech normal, alert and oriented HEENT: PERRLA, EOM intact Pulmonary: No wheezing, No rales  CardiovascularNormal S1,S2.  No m/r/g.  Abdomen: Benign, Soft, non-tender, No masses Renal:  No costovertebral tenderness  GU:  No performed at this time. Endoc: No evident  thyromegaly, no signs of acromegaly or Cushing features Skin:   warm, no rashes, no ecchymosis  Extremities: normal, no cyanosis, clubbing.       LABORATORY PANEL:   CBC No results for input(s): WBC, HGB, HCT, PLT in the last 168 hours. ------------------------------------------------------------------------------------------------------------------  Chemistries  No results for input(s): NA, K, CL, CO2, GLUCOSE, BUN, CREATININE, CALCIUM, MG, AST, ALT, ALKPHOS, BILITOT in the last 168 hours.  Invalid input(s): GFRCGP ------------------------------------------------------------------------------------------------------------------  Cardiac Enzymes No results for input(s): TROPONINI in the last 168 hours. ------------------------------------------------------------  RADIOLOGY:  No results found.     Thank  you for the consultation and for allowing [facility] to assist in the care of your patient. Our recommendations are noted above.  Please contact us if we can be of further service.  Marda Stalker, M.D., F.C.C.P.  Board Certified in Internal Medicine, Pulmonary Medicine, [place], and Sleep Medicine.  [facility] Office Number: [phone]  09/11/2018

## 2018-09-12 ENCOUNTER — Encounter: Payer: Self-pay | Admitting: Internal Medicine

## 2018-09-12 ENCOUNTER — Ambulatory Visit: Payer: Self-pay | Admitting: Urology

## 2018-09-12 ENCOUNTER — Ambulatory Visit (INDEPENDENT_AMBULATORY_CARE_PROVIDER_SITE_OTHER): Payer: No Typology Code available for payment source | Admitting: Internal Medicine

## 2018-09-12 VITALS — BP 130/84 | HR 64 | Ht 70.75 in | Wt 327.6 lb

## 2018-09-12 DIAGNOSIS — C3490 Malignant neoplasm of unspecified part of unspecified bronchus or lung: Secondary | ICD-10-CM

## 2018-09-12 DIAGNOSIS — G4733 Obstructive sleep apnea (adult) (pediatric): Secondary | ICD-10-CM | POA: Diagnosis not present

## 2018-09-12 DIAGNOSIS — R0609 Other forms of dyspnea: Secondary | ICD-10-CM | POA: Diagnosis not present

## 2018-09-12 NOTE — Addendum Note (Signed)
Addended by: Darreld Mclean on: 09/12/2018 11:11 AM   Modules accepted: Orders

## 2018-09-12 NOTE — Addendum Note (Signed)
Addended by: Laverle Hobby on: 09/12/2018 11:11 AM   Modules accepted: Orders

## 2018-09-12 NOTE — Patient Instructions (Signed)
Continue using CPAP every night.  Use albuterol as needed for trouble breathing.  Weight loss may help with breathing.

## 2018-11-04 ENCOUNTER — Emergency Department: Payer: No Typology Code available for payment source

## 2018-11-04 ENCOUNTER — Encounter: Payer: Self-pay | Admitting: Intensive Care

## 2018-11-04 ENCOUNTER — Other Ambulatory Visit: Payer: Self-pay

## 2018-11-04 ENCOUNTER — Emergency Department
Admission: EM | Admit: 2018-11-04 | Discharge: 2018-11-04 | Disposition: A | Discharge status: Home / Self Care | Payer: No Typology Code available for payment source | Attending: Emergency Medicine | Admitting: Emergency Medicine

## 2018-11-04 DIAGNOSIS — Z91040 Latex allergy status: Secondary | ICD-10-CM | POA: Insufficient documentation

## 2018-11-04 DIAGNOSIS — Z79899 Other long term (current) drug therapy: Secondary | ICD-10-CM | POA: Insufficient documentation

## 2018-11-04 DIAGNOSIS — R1032 Left lower quadrant pain: Secondary | ICD-10-CM | POA: Diagnosis present

## 2018-11-04 DIAGNOSIS — I10 Essential (primary) hypertension: Secondary | ICD-10-CM | POA: Insufficient documentation

## 2018-11-04 DIAGNOSIS — K5732 Diverticulitis of large intestine without perforation or abscess without bleeding: Secondary | ICD-10-CM

## 2018-11-04 DIAGNOSIS — K5792 Diverticulitis of intestine, part unspecified, without perforation or abscess without bleeding: Secondary | ICD-10-CM | POA: Insufficient documentation

## 2018-11-04 LAB — URINALYSIS, COMPLETE (UACMP) WITH MICROSCOPIC
Bilirubin Urine: NEGATIVE
GLUCOSE, UA: NEGATIVE mg/dL
Hgb urine dipstick: NEGATIVE
Ketones, ur: NEGATIVE mg/dL
Leukocytes, UA: NEGATIVE
Nitrite: NEGATIVE
PH: 8 (ref 5.0–8.0)
Protein, ur: NEGATIVE mg/dL
SPECIFIC GRAVITY, URINE: 1.017 (ref 1.005–1.030)
WBC UA: NONE SEEN WBC/hpf (ref 0–5)

## 2018-11-04 LAB — COMPREHENSIVE METABOLIC PANEL
ALK PHOS: 51 U/L (ref 38–126)
ALT: 18 U/L (ref 0–44)
ANION GAP: 7 (ref 5–15)
AST: 14 U/L — ABNORMAL LOW (ref 15–41)
Albumin: 4.3 g/dL (ref 3.5–5.0)
BILIRUBIN TOTAL: 1 mg/dL (ref 0.3–1.2)
BUN: 10 mg/dL (ref 6–20)
CALCIUM: 9 mg/dL (ref 8.9–10.3)
CO2: 29 mmol/L (ref 22–32)
Chloride: 104 mmol/L (ref 98–111)
Creatinine, Ser: 0.91 mg/dL (ref 0.61–1.24)
GFR calc non Af Amer: >60 mL/min (ref >60)
GLUCOSE: 107 mg/dL — AB (ref 70–99)
Potassium: 3.9 mmol/L (ref 3.5–5.1)
Sodium: 140 mmol/L (ref 135–145)
TOTAL PROTEIN: 7.8 g/dL (ref 6.5–8.1)

## 2018-11-04 LAB — CBC
HCT: 43.8 % (ref 39.0–52.0)
Hemoglobin: 14.9 g/dL (ref 13.0–17.0)
MCH: 31.1 pg (ref 26.0–34.0)
MCHC: 34 g/dL (ref 30.0–36.0)
MCV: 91.4 fL (ref 80.0–100.0)
NRBC: 0 % (ref 0.0–0.2)
PLATELETS: 204 10*3/uL (ref 150–400)
RBC: 4.79 MIL/uL (ref 4.22–5.81)
RDW: 13.2 % (ref 11.5–15.5)
WBC: 13.4 10*3/uL — ABNORMAL HIGH (ref 4.0–10.5)

## 2018-11-04 LAB — LIPASE, BLOOD: Lipase: 32 U/L (ref 11–51)

## 2018-11-04 MED ORDER — AMOXICILLIN-POT CLAVULANATE 875-125 MG PO TABS: 1.0000 | ORAL_TABLET | Freq: Two times a day (BID) | ORAL | 0 refills | Status: AC

## 2018-11-04 MED ORDER — IOPAMIDOL (ISOVUE-370) INJECTION 76%
100.0000 mL | Freq: Once | INTRAVENOUS | Status: AC | PRN
  Administered 2018-11-04: 100 mL via INTRAVENOUS
  Filled 2018-11-04: qty 100

## 2018-11-04 MED ORDER — PIPERACILLIN-TAZOBACTAM 3.375 G IVPB 30 MIN
3.3750 g | Freq: Once | INTRAVENOUS | Status: AC
  Administered 2018-11-04: 3.375 g via INTRAVENOUS
  Filled 2018-11-04 (×2): qty 50

## 2018-11-04 MED ORDER — ONDANSETRON 4 MG PO TBDP: 4.0000 mg | ORAL_TABLET | Freq: Three times a day (TID) | ORAL | 0 refills | Status: DC | PRN

## 2018-11-04 MED ORDER — KETOROLAC TROMETHAMINE 30 MG/ML IJ SOLN
15.0000 mg | Freq: Once | INTRAMUSCULAR | Status: AC
  Administered 2018-11-04: 15 mg via INTRAVENOUS
  Filled 2018-11-04: qty 1

## 2018-11-04 MED ORDER — METRONIDAZOLE 500 MG PO TABS: 500.0000 mg | ORAL_TABLET | Freq: Three times a day (TID) | ORAL | 0 refills | Status: AC

## 2018-11-04 MED ORDER — OXYCODONE-ACETAMINOPHEN 5-325 MG PO TABS: 1.0000 | ORAL_TABLET | ORAL | 0 refills | Status: DC | PRN

## 2018-11-04 NOTE — ED Triage Notes (Signed)
PAtient c/o right and left sided lower abdominal pain since yesterday morning that radiates to pelvic area. C/o pain and burning when urinating. Frequent urination. HX kidney stone years ago.

## 2018-11-04 NOTE — ED Notes (Signed)
Patient transported to CT 

## 2018-11-04 NOTE — ED Provider Notes (Signed)
[facility] Emergency Department Provider Note  ____________________________________________  Time seen: Approximately 4:21 PM  I have reviewed the triage vital signs and the nursing notes.   HISTORY  Chief Complaint Abdominal Pain (lower)   HPI Devon Miranda is a 47 y.o. male with a history of hypertension who presents for evaluation of abdominal pain.  Patient reports that pain started this morning, located in the lower abdomen, worse on the left lower quadrant, sharp and nonradiating.  Pain is associated with a low-grade fever of 9F, nausea and chills.  No vomiting.  Patient reports having very small bowel watery bowel movements over the last few days.  He denies radiation of the pain to his back.  He also complains of increase urinary frequency and mild dysuria this morning however those symptoms have resolved.  No history of UTI or STDs.  No prior abdominal surgeries.  At this time the pain is mild in intensity.  Past Medical History:  Diagnosis Date  . Essential hypertension, benign   . Near syncope 04/24/2012  . Other and unspecified hyperlipidemia   . Other testicular hypofunction   . PVC (premature ventricular contraction) 11/06/2013  . Unspecified essential hypertension     Patient Active Problem List   Diagnosis Date Noted  . Chronic pain of right knee 08/06/2018  . Encounter for general adult medical examination with abnormal findings 06/06/2018  . Scrotal pain 10/29/2017  . Pain in right testicle 10/23/2017  . OSA (obstructive sleep apnea) 10/23/2017  . Lightheadedness 10/07/2017  . Decreased hearing of left ear 10/07/2017  . Hypertriglyceridemia 10/07/2017  . History of hypoglycemia 10/07/2017  . PVC (premature ventricular contraction) 11/06/2013  . Anxiety 11/06/2013  . Bradycardia 09/21/2013  . Dyspnea 04/24/2012  . Near syncope 04/24/2012  . Hypertension 04/24/2012    Past Surgical History:  Procedure Laterality Date  .  arthroscopic   1991   left knee surgery  . [place]    Prior to Admission medications   Medication Sig Start Date End Date Taking? Authorizing Provider  Albuterol Sulfate (PROAIR RESPICLICK) 798 (90 Base) MCG/ACT AEPB Inhale 1-2 puffs into the lungs every 6 (six) hours as needed. 05/19/18   Laverle Hobby, MD  amoxicillin-clavulanate (AUGMENTIN) 875-125 MG tablet Take 1 tablet by mouth 2 (two) times daily for 10 days. 11/04/18 11/14/18  Rudene Re, MD  Coenzyme Q10 (CO Q 10) 100 MG CAPS Take by mouth daily.    [provider]  fenofibrate 160 MG tablet Take 160 mg by mouth daily.    [provider]  lisinopril-hydrochlorothiazide (PRINZIDE,ZESTORETIC) 20-12.5 MG tablet Take by mouth daily. 05/23/17   [provider]  metroNIDAZOLE (FLAGYL) 500 MG tablet Take 1 tablet (500 mg total) by mouth 3 (three) times daily for 10 days. 11/04/18 11/14/18  Rudene Re, MD  Multiple Vitamin (MULTIVITAMIN) capsule Take 1 capsule daily by mouth.    [provider]  Omega-3 Fatty Acids (FISH OIL) 1200 MG CPDR Take by mouth. Takes 4 tablets daily.    [provider]  ondansetron (ZOFRAN ODT) 4 MG disintegrating tablet Take 1 tablet (4 mg total) by mouth every 8 (eight) hours as needed for nausea or vomiting. 11/04/18   Rudene Re, MD  oxyCODONE-acetaminophen (PERCOCET) 5-325 MG tablet Take 1 tablet by mouth every 4 (four) hours as needed for severe pain. 11/04/18   Rudene Re, MD    Allergies Latex  Family History  Problem Relation Age  of Onset  . Hyperlipidemia Mother   . Hypertension Mother   . Arthritis Mother   . Stroke Father   . Hyperlipidemia Father   . Hypertension Father   . Alcohol abuse Father   . Arthritis Father   . Colon cancer Maternal Grandfather   . Prostate cancer Neg Hx   . Bladder Cancer Neg Hx   . Kidney cancer Neg Hx     Social History Social History    Tobacco Use  . Smoking status: Never Smoker  . Smokeless tobacco: Never Used  Substance Use Topics  . Alcohol use: Yes    Comment: occassionally  . Drug use: No    Review of Systems  Constitutional: Negative for fever. + chills Eyes: Negative for visual changes. ENT: Negative for sore throat. Neck: No neck pain  Cardiovascular: Negative for chest pain. Respiratory: Negative for shortness of breath. Gastrointestinal: + lower abdominal pain and nausea. No vomiting or diarrhea. Genitourinary: Negative for dysuria. Musculoskeletal: Negative for back pain. Skin: Negative for rash. Neurological: Negative for headaches, weakness or numbness. Psych: No SI or HI  ____________________________________________   PHYSICAL EXAM:  VITAL SIGNS: ED Triage Vitals  Enc Vitals Group     BP 11/04/18 1311 (!) 154/84     Pulse Rate 11/04/18 1311 82     Resp 11/04/18 1311 18     Temp 11/04/18 1311 99.8 F (37.7 C)     Temp Source 11/04/18 1311 Oral     SpO2 11/04/18 1311 98 %     Weight 11/04/18 1311 (!) 325 lb (147.4 kg)     Height 11/04/18 1311 6\' 1"  (1.854 m)     Head Circumference --      Peak Flow --      Pain Score 11/04/18 1316 5     Pain Loc --      Pain Edu? --      Excl. in [place]? --     Constitutional: Alert and oriented. Well appearing and in no apparent distress. HEENT:      Head: Normocephalic and atraumatic.         Eyes: Conjunctivae are normal. Sclera is non-icteric.       Mouth/Throat: Mucous membranes are moist.       Neck: Supple with no signs of meningismus. Cardiovascular: Regular rate and rhythm. No murmurs, gallops, or rubs. 2+ symmetrical distal pulses are present in all extremities. No JVD. Respiratory: Normal respiratory effort. Lungs are clear to auscultation bilaterally. No wheezes, crackles, or rhonchi.  Gastrointestinal: Soft, tender to palpation in the periumbilical and suprapubic regions, and non distended with positive bowel sounds. No rebound or  guarding. Musculoskeletal: Nontender with normal range of motion in all extremities. No edema, cyanosis, or erythema of extremities. Neurologic: Normal speech and language. Face is symmetric. Moving all extremities. No gross focal neurologic deficits are appreciated. Skin: Skin is warm, dry and intact. No rash noted. Psychiatric: Mood and affect are normal. Speech and behavior are normal.  ____________________________________________   LABS (all labs ordered are listed, but only abnormal results are displayed)  Labs Reviewed  COMPREHENSIVE METABOLIC PANEL - Abnormal; Notable for the following components:      Result Value   Glucose, Bld 107 (*)    AST 14 (*)    All other components within normal limits  CBC - Abnormal; Notable for the following components:   WBC 13.4 (*)    All other components within normal limits  URINALYSIS, COMPLETE (UACMP) WITH MICROSCOPIC -  Abnormal; Notable for the following components:   Color, Urine AMBER (*)    APPearance TURBID (*)    Bacteria, UA RARE (*)    All other components within normal limits  LIPASE, BLOOD   ____________________________________________  EKG  none  ____________________________________________  RADIOLOGY  I have personally reviewed the images performed during this visit and I agree with the Radiologist's read.   Interpretation by Radiologist:  Ct Abdomen Pelvis W Contrast  Result Date: 11/04/2018 CLINICAL DATA:  Abdominal pain and dysuria EXAM: CT ABDOMEN AND PELVIS WITH CONTRAST TECHNIQUE: Multidetector CT imaging of the abdomen and pelvis was performed using the standard protocol following bolus administration of intravenous contrast. CONTRAST:  113mL ISOVUE-370 IOPAMIDOL (ISOVUE-370) INJECTION 76% COMPARISON:  None. FINDINGS: Lower chest: Lung bases are clear. Hepatobiliary: There is hepatic steatosis. No focal liver lesions are evident. Gallbladder wall is not appreciably thickened. There is no biliary duct dilatation.  Pancreas: No pancreatic mass or inflammatory focus. Spleen: No splenic lesions are evident. Adrenals/Urinary Tract: Adrenals bilaterally appear unremarkable. Kidneys bilaterally show no evident mass or hydronephrosis on either side. There is no evident renal or ureteral calculus on either side. Urinary bladder is midline with wall thickness within normal limits. Stomach/Bowel: There is wall thickening in the proximal to mid sigmoid colon region with surrounding mesenteric thickening. A single somewhat irregular diverticulum is noted in this area. There is no abscess or perforation in this area sigmoid diverticulitis. No other inflammatory change involving bowel is evident on this study. There is no evident bowel obstruction. There is no free air or portal venous air. Note that the most small bowel loops are fluid filled. Vascular/Lymphatic: There is no abdominal aortic aneurysm or dissection. No vascular lesions are evident on this study. There is no adenopathy appreciable in the abdomen or pelvis. Reproductive: There are a few prostatic calculi. Prostate and seminal vesicles are normal in size and contour. No pelvic mass is evident. Other: There is no periappendiceal region inflammation. There is no abscess or ascites evident in the abdomen or pelvis. There is a small ventral hernia containing only fat. There is fat in each inguinal ring. Musculoskeletal: There are no blastic or lytic bone lesions. There are pars defects at L5 bilaterally with 2 mm of anterolisthesis of L5 on S1. No intramuscular lesions are appreciable. IMPRESSION: 1. Focal area of diverticulitis in the proximal to mid sigmoid colon without associated abscess or perforation. 2. No bowel obstruction. Most small bowel loops are fluid-filled. Question a degree of enteritis or ileus, likely secondary to the sigmoid diverticulitis. No periappendiceal region inflammation. 3. No renal or ureteral calculus. No hydronephrosis. A few small prostatic calculi  are noted. 4. Small ventral hernia containing only fat. There is fat in each inguinal ring. 5.  Hepatic steatosis. 6.  Pars defects at L5 with slight spondylolisthesis at L5-S1. Electronically Signed   By: Lowella Grip III M.D.   On: 11/04/2018 16:15     ____________________________________________   PROCEDURES  Procedure(s) performed: None Procedures Critical Care performed:  None ____________________________________________   INITIAL IMPRESSION / ASSESSMENT AND PLAN / ED COURSE  47 y.o. male with a history of hypertension who presents for evaluation of abdominal pain since this morning.  Patient with a low-grade temp of 109F and white count of 13K.  He is tender to palpation on the periumbilical and suprapubic regions.  Differential diagnoses including appendicitis versus cystitis versus diverticulitis.  UA negative for UTI.  CT was done which showed uncomplicated sigmoid diverticulitis.  Patient given a dose of IV Zosyn and will be discharged home on Augmentin and Flagyl.  Discussed standard return precautions and close follow-up.      As part of my medical decision making, I reviewed the following data within the [place] notes reviewed and incorporated, Labs reviewed , Old chart reviewed, Radiograph reviewed , Notes from prior ED visits and [place] Controlled Substance Database    Pertinent labs & imaging results that were available during my care of the patient were reviewed by me and considered in my medical decision making (see chart for details).    ____________________________________________   FINAL CLINICAL IMPRESSION(S) / ED DIAGNOSES  Final diagnoses:  Sigmoid diverticulitis      NEW MEDICATIONS STARTED DURING THIS VISIT:  ED Discharge Orders         Ordered    metroNIDAZOLE (FLAGYL) 500 MG tablet  3 times daily     11/04/18 1628    amoxicillin-clavulanate (AUGMENTIN) 875-125 MG tablet  2 times daily     11/04/18 1628     ondansetron (ZOFRAN ODT) 4 MG disintegrating tablet  Every 8 hours PRN     11/04/18 1628    oxyCODONE-acetaminophen (PERCOCET) 5-325 MG tablet  Every 4 hours PRN     11/04/18 1628           Note:  This document was prepared using Dragon voice recognition software and may include unintentional dictation errors.    Rudene Re, MD 11/04/18 [phone]

## 2018-11-13 ENCOUNTER — Encounter: Payer: Self-pay | Admitting: Family Medicine

## 2018-11-14 ENCOUNTER — Ambulatory Visit: Payer: Self-pay

## 2018-11-14 NOTE — Telephone Encounter (Signed)
Pt. Seen 10/1918 with new diagnosis of Diverticulitis. Still on antibiotics. Has occasional sharp, diffuse "gas pain." "Comes and goes quickly". Pt. Has an appointment 11/28/18 for regular follow up. Would like to be seen sooner if possible. Concerned because he is having these intermittent pains.Please advise pt. Answer Assessment - Initial Assessment Questions 1. LOCATION: "Where does it hurt?"       Hurts at waist line on the left 2. RADIATION: "Does the pain shoot anywhere else?" (e.g., chest, back)      No 3. ONSET: "When did the pain begin?" (Minutes, hours or days ago)      Started 1 week ago 4. SUDDEN: "Gradual or sudden onset?"     Sudden 5. PATTERN "Does the pain come and go, or is it constant?"    - If constant: "Is it getting better, staying the same, or worsening?"      (Note: Constant means the pain never goes away completely; most serious pain is constant and it progresses)     - If intermittent: "How long does it last?" "Do you have pain now?"     (Note: Intermittent means the pain goes away completely between bouts)     Getting better 6. SEVERITY: "How bad is the pain?"  (e.g., Scale 1-10; mild, moderate, or severe)    - MILD (1-3): doesn't interfere with normal activities, abdomen soft and not tender to touch     - MODERATE (4-7): interferes with normal activities or awakens from sleep, tender to touch     - SEVERE (8-10): excruciating pain, doubled over, unable to do any normal activities       Mild 7. RECURRENT SYMPTOM: "Have you ever had this type of abdominal pain before?" If so, ask: "When was the last time?" and "What happened that time?"      No - new diagnosis 8. CAUSE: "What do you think is causing the abdominal pain?"     Diverticulitis 9. RELIEVING/AGGRAVATING FACTORS: "What makes it better or worse?" (e.g., movement, antacids, bowel movement)     Nothing 10. OTHER SYMPTOMS: "Has there been any vomiting, diarrhea, constipation, or urine problems?"        No  Protocols used: ABDOMINAL PAIN - MALE-A-AH

## 2018-11-17 NOTE — Telephone Encounter (Signed)
Still having pain off and on LLQ of abdomen can go 24 hours with no pain. Happens when first wakes up or an hour after eating. Painful when going from sitting to standing after sitting for long period of time. Patient has one dose of flagyl left and has completed the Augmentin.

## 2018-11-17 NOTE — Telephone Encounter (Signed)
See phone note.  Patient is scheduled for Wednesday for reevaluation.

## 2018-11-17 NOTE — Telephone Encounter (Signed)
Patient prefers to see PCP scheduled for 11/19/18

## 2018-11-17 NOTE — Telephone Encounter (Signed)
Noted. He could see me on Wednesday at 3:15 or he could see Lauren for recheck. If his pain worsens or he develops fever or blood in his stool he needs to go to the ED. Thanks.

## 2018-11-19 ENCOUNTER — Encounter: Payer: Self-pay | Admitting: Family Medicine

## 2018-11-19 ENCOUNTER — Ambulatory Visit (INDEPENDENT_AMBULATORY_CARE_PROVIDER_SITE_OTHER): Payer: No Typology Code available for payment source | Admitting: Family Medicine

## 2018-11-19 VITALS — BP 130/80 | HR 68 | Temp 97.8°F | Ht 73.0 in | Wt 330.4 lb

## 2018-11-19 DIAGNOSIS — K5792 Diverticulitis of intestine, part unspecified, without perforation or abscess without bleeding: Secondary | ICD-10-CM | POA: Insufficient documentation

## 2018-11-19 NOTE — Progress Notes (Signed)
  Tommi Rumps, MD Phone: [phone]  Devon Miranda is a 47 y.o. male who presents today for follow-up.  CC: Diverticulitis  Patient was evaluated in the emergency department on 11/04/2018.  He was diagnosed with sigmoid diverticulitis.  He had lower abdominal pain that was sharp and nonradiating.  He had a low-grade fever of 99 F.  He had some nausea and chills.  No vomiting.  He had intermittent watery bowel movements.  He was discharged on Augmentin and Flagyl.  He reports he has completed the Augmentin.  He is still on Flagyl.  He notes over the last 2 days he has felt increasingly better.  He notes only mild discomfort when palpating his left lower quadrant.  No other discomfort.  He did report some difficulty urinating the first day he had the pain and had some burning as well though that has not recurred and his urinalysis was unremarkable for UTI.  He has had no blood in his urine or stool.  He notes no additional fevers or chills.  Social History   Tobacco Use  Smoking Status Never Smoker  Smokeless Tobacco Never Used     ROS see history of present illness  Objective  Physical Exam Vitals:   11/19/18 1548  BP: 130/80  Pulse: 68  Temp: 97.8 F (36.6 C)  SpO2: 94%    BP Readings from Last 3 Encounters:  11/19/18 130/80  11/04/18 138/68  09/12/18 130/84   Wt Readings from Last 3 Encounters:  11/19/18 (!) 330 lb 6.4 oz (149.9 kg)  11/04/18 (!) 325 lb (147.4 kg)  09/12/18 (!) 327 lb 9.6 oz (148.6 kg)    Physical Exam  Constitutional: No distress.  Cardiovascular: Normal rate, regular rhythm and normal heart sounds.  Pulmonary/Chest: Effort normal and breath sounds normal.  Abdominal: Soft. Bowel sounds are normal. He exhibits no distension and no mass. There is tenderness (Minimal left lower quadrant tenderness). There is no rebound and no guarding.  Musculoskeletal: He exhibits no edema.  Neurological: He is alert.  Skin: Skin is warm and dry. He is not  diaphoretic.     Assessment/Plan: Please see individual problem list.  Diverticulitis Patient has improved over the last several days.  He has minimal tenderness on exam.  We will check a CBC to evaluate his white blood cell count.  Given improvement he does not need imaging at this time.  We will plan to recheck in about a week to ensure continued improvement.  Discussed that if he has any worsening symptoms he needs to let us know and be reevaluated right away.  We will refer to GI to consider colonoscopy.    Orders Placed This Encounter  Procedures  . CBC w/Diff  . Ambulatory referral to Gastroenterology    Referral Priority:   Routine    Referral Type:   Consultation    Referral Reason:   Specialty Services Required    Number of Visits Requested:   1    No orders of the defined types were placed in this encounter.    Tommi Rumps, MD [place]

## 2018-11-19 NOTE — Patient Instructions (Signed)
Nice to see you. We will check a CBC to evaluate your white blood cell count.   We will recheck you in 1 week.  If your symptoms worsen please let us know and be evaluated immediately. If you develop fever, worsening abdominal pain, or any new or changing symptoms please seek medical attention.

## 2018-11-19 NOTE — Assessment & Plan Note (Signed)
Patient has improved over the last several days.  He has minimal tenderness on exam.  We will check a CBC to evaluate his white blood cell count.  Given improvement he does not need imaging at this time.  We will plan to recheck in about a week to ensure continued improvement.  Discussed that if he has any worsening symptoms he needs to let us know and be reevaluated right away.  We will refer to GI to consider colonoscopy.

## 2018-11-20 LAB — CBC WITH DIFFERENTIAL/PLATELET
Basophils Absolute: 0.1 10*3/uL (ref 0.0–0.1)
Basophils Relative: 1.1 % (ref 0.0–3.0)
Eosinophils Absolute: 0.1 10*3/uL (ref 0.0–0.7)
Eosinophils Relative: 1.7 % (ref 0.0–5.0)
HCT: 40.9 % (ref 39.0–52.0)
HEMOGLOBIN: 13.8 g/dL (ref 13.0–17.0)
Lymphocytes Relative: 21.9 % (ref 12.0–46.0)
Lymphs Abs: 1.5 10*3/uL (ref 0.7–4.0)
MCHC: 33.8 g/dL (ref 30.0–36.0)
MCV: 90.1 fl (ref 78.0–100.0)
MONOS PCT: 9.5 % (ref 3.0–12.0)
Monocytes Absolute: 0.7 10*3/uL (ref 0.1–1.0)
Neutro Abs: 4.6 10*3/uL (ref 1.4–7.7)
Neutrophils Relative %: 65.8 % (ref 43.0–77.0)
PLATELETS: 301 10*3/uL (ref 150.0–400.0)
RBC: 4.54 Mil/uL (ref 4.22–5.81)
RDW: 13.4 % (ref 11.5–15.5)
WBC: 6.9 10*3/uL (ref 4.0–10.5)

## 2018-11-28 ENCOUNTER — Encounter: Payer: Self-pay | Admitting: Family Medicine

## 2018-11-28 ENCOUNTER — Ambulatory Visit (INDEPENDENT_AMBULATORY_CARE_PROVIDER_SITE_OTHER): Payer: No Typology Code available for payment source | Admitting: Family Medicine

## 2018-11-28 VITALS — BP 130/82 | HR 73 | Temp 98.3°F | Ht 73.0 in | Wt 323.0 lb

## 2018-11-28 DIAGNOSIS — M545 Low back pain, unspecified: Secondary | ICD-10-CM

## 2018-11-28 DIAGNOSIS — E781 Pure hyperglyceridemia: Secondary | ICD-10-CM

## 2018-11-28 DIAGNOSIS — I10 Essential (primary) hypertension: Secondary | ICD-10-CM | POA: Diagnosis not present

## 2018-11-28 DIAGNOSIS — G8929 Other chronic pain: Secondary | ICD-10-CM

## 2018-11-28 DIAGNOSIS — K5792 Diverticulitis of intestine, part unspecified, without perforation or abscess without bleeding: Secondary | ICD-10-CM

## 2018-11-28 MED ORDER — LISINOPRIL-HYDROCHLOROTHIAZIDE 20-12.5 MG PO TABS: 1.0000 | ORAL_TABLET | Freq: Every day | ORAL | 1 refills | Status: DC

## 2018-11-28 MED ORDER — FENOFIBRATE 160 MG PO TABS: 160.0000 mg | ORAL_TABLET | Freq: Every day | ORAL | 1 refills | Status: DC

## 2018-11-28 NOTE — Progress Notes (Signed)
Tommi Rumps, MD Phone: [phone]  Devon Miranda is a 47 y.o. male who presents today for follow-up.  CC: Hypertension, hypertriglyceridemia, diverticulitis, back pain  Hypertension: Typically 135-138/82-83.  He has been out of his lisinopril and HCTZ for the past 2 days.  No chest pain, shortness of breath, or edema.  Hypertriglyceridemia: Taking fenofibrate.  No right upper quadrant pain or myalgias.  Due for lipid panel.  Diverticulitis: Patient notes his pain has improved progressively.  Occasionally he will have a sharp discomfort in his lower abdomen though overall improving.  No fevers.  He has a GI appointment scheduled.  Patient completed antibiotics.  Back pain: Patient notes this is a chronic issue.  He feels as though he may have a pinched nerve.  Occasionally gets back pain in his mid back.  Also has low back pain.  Does not radiate.  Notes his pain is worse when he is laying down.  He notes if he stands up the front aspect of his left leg will become numb.  If he changes positions this will go away.  Seeing a chiropractor does help and he has had imaging through them.  He has done physical therapy in the past.  No weakness, saddle anesthesia, or bowel or bladder incontinence.  Social History   Tobacco Use  Smoking Status Never Smoker  Smokeless Tobacco Never Used     ROS see history of present illness  Objective  Physical Exam Vitals:   11/28/18 1531  BP: 130/82  Pulse: 73  Temp: 98.3 F (36.8 C)  SpO2: 98%    BP Readings from Last 3 Encounters:  11/28/18 130/82  11/19/18 130/80  11/04/18 138/68   Wt Readings from Last 3 Encounters:  11/28/18 (!) 323 lb (146.5 kg)  11/19/18 (!) 330 lb 6.4 oz (149.9 kg)  11/04/18 (!) 325 lb (147.4 kg)    Physical Exam Constitutional:      General: He is not in acute distress.    Appearance: He is not diaphoretic.  Cardiovascular:     Rate and Rhythm: Normal rate and regular rhythm.     Heart sounds: Normal  heart sounds.  Pulmonary:     Effort: Pulmonary effort is normal.     Breath sounds: Normal breath sounds.  Abdominal:     General: Bowel sounds are normal. There is no distension.     Palpations: Abdomen is soft.     Tenderness: There is no abdominal tenderness. There is no guarding.     Hernia: No hernia is present.  Musculoskeletal:     Right lower leg: No edema.     Left lower leg: No edema.     Comments: No midline spine tenderness, no midline spine step-off, no muscular back tenderness  Skin:    General: Skin is warm and dry.  Neurological:     Mental Status: He is alert.     Comments: 5/5 strength bilateral quads, hamstrings, plantar flexion, and dorsiflexion, sensation to light touch intact bilateral lower extremities, absent patellar reflexes      Assessment/Plan: Please see individual problem list.  Hypertension Seems to be adequately controlled.  We will refill his medication.  Diverticulitis Benign abdominal exam.  Symptoms have improved.  He will keep his appointment with GI.  If he has worsening symptoms he will be reevaluated.  Hypertriglyceridemia Patient will return for a fasting lipid panel.  Continue fenofibrate.  Chronic low back pain Wound discuss further evaluation with MRI versus referral to an orthopedist.  Patient  deferred these at this time and opted to monitor.  I advised he could contact us if he changes mind regarding MRI.  We would order this at that time as long as his symptoms had not changed or worsened.    Orders Placed This Encounter  Procedures  . Lipid panel    Standing Status:   Future    Standing Expiration Date:   11/29/2019    Meds ordered this encounter  Medications  . fenofibrate 160 MG tablet    Sig: Take 1 tablet (160 mg total) by mouth daily.    Dispense:  90 tablet    Refill:  1  . lisinopril-hydrochlorothiazide (PRINZIDE,ZESTORETIC) 20-12.5 MG tablet    Sig: Take 1 tablet by mouth daily.    Dispense:  90 tablet     Refill:  1     Tommi Rumps, MD [place]

## 2018-11-28 NOTE — Patient Instructions (Signed)
Nice to see you. We will have you return next week to check your cholesterol. If you decide you want to do the MRI for your back please let us know. If you have recurrence of abdominal plain please be reevaluated.

## 2018-11-28 NOTE — Assessment & Plan Note (Signed)
Seems to be adequately controlled.  We will refill his medication.

## 2018-11-28 NOTE — Assessment & Plan Note (Signed)
Benign abdominal exam.  Symptoms have improved.  He will keep his appointment with GI.  If he has worsening symptoms he will be reevaluated.

## 2018-11-28 NOTE — Assessment & Plan Note (Signed)
Patient will return for a fasting lipid panel.  Continue fenofibrate.

## 2018-11-28 NOTE — Assessment & Plan Note (Signed)
Wound discuss further evaluation with MRI versus referral to an orthopedist.  Patient deferred these at this time and opted to monitor.  I advised he could contact us if he changes mind regarding MRI.  We would order this at that time as long as his symptoms had not changed or worsened.

## 2018-12-04 ENCOUNTER — Other Ambulatory Visit (INDEPENDENT_AMBULATORY_CARE_PROVIDER_SITE_OTHER): Payer: No Typology Code available for payment source

## 2018-12-04 DIAGNOSIS — E781 Pure hyperglyceridemia: Secondary | ICD-10-CM | POA: Diagnosis not present

## 2018-12-04 LAB — LIPID PANEL
CHOL/HDL RATIO: 5
Cholesterol: 178 mg/dL (ref 0–200)
HDL: 39.2 mg/dL (ref >39.00)
NONHDL: 138.99
Triglycerides: 219 mg/dL — ABNORMAL HIGH (ref 0.0–149.0)
VLDL: 43.8 mg/dL — AB (ref 0.0–40.0)

## 2018-12-04 LAB — LDL CHOLESTEROL, DIRECT: LDL DIRECT: 108 mg/dL

## 2018-12-24 ENCOUNTER — Ambulatory Visit (INDEPENDENT_AMBULATORY_CARE_PROVIDER_SITE_OTHER): Payer: No Typology Code available for payment source | Admitting: Gastroenterology

## 2018-12-24 ENCOUNTER — Other Ambulatory Visit: Payer: Self-pay

## 2018-12-24 ENCOUNTER — Encounter: Payer: Self-pay | Admitting: Gastroenterology

## 2018-12-24 VITALS — BP 133/85 | HR 63 | Ht 73.0 in | Wt 333.2 lb

## 2018-12-24 DIAGNOSIS — K5732 Diverticulitis of large intestine without perforation or abscess without bleeding: Secondary | ICD-10-CM

## 2018-12-24 DIAGNOSIS — K5792 Diverticulitis of intestine, part unspecified, without perforation or abscess without bleeding: Secondary | ICD-10-CM

## 2018-12-24 DIAGNOSIS — K219 Gastro-esophageal reflux disease without esophagitis: Secondary | ICD-10-CM | POA: Insufficient documentation

## 2018-12-24 NOTE — Progress Notes (Signed)
Gastroenterology Consultation  Referring Provider:     Leone Haven, MD Primary Care Physician:  Leone Haven, MD Primary Gastroenterologist:  Dr. Allen Norris     Reason for Consultation:     Diverticulitis         HPI:   Devon Miranda is a 48 y.o. y/o male referred for consultation & management of diverticulitis by Dr. Caryl Bis, Angela Adam, MD.  This patient comes in today after being diagnosed with diverticulitis back in November.  The patient was having left lower quadrant pain without any rectal bleeding and had a CT scan that showed him to have diverticulitis.  The patient had taken antibiotics and was still having pain approximately 1 week after finishing the antibiotics and his primary care physician saw him and recommended he come see [place].  There is no report of any unexplained weight loss.  The patient states he does not sleep well but he states that is because he is so busy.  He denies any family history of colon cancer or colon polyps.  He also denies any family history of diverticulitis. The patient has never had a colonoscopy the past.  The patient reports that his abdominal pain has completely resolved at this time.  Past Medical History:  Diagnosis Date  . Essential hypertension, benign   . Near syncope 04/24/2012  . Other and unspecified hyperlipidemia   . Other testicular hypofunction   . PVC (premature ventricular contraction) 11/06/2013  . Unspecified essential hypertension     Past Surgical History:  Procedure Laterality Date  . arthroscopic   1991   left knee surgery  . [place]    Prior to Admission medications   Medication Sig Start Date End Date Taking? Authorizing Provider  Albuterol Sulfate (PROAIR RESPICLICK) 119 (90 Base) MCG/ACT AEPB Inhale 1-2 puffs into the lungs every 6 (six) hours as needed. 05/19/18  Yes Laverle Hobby, MD  Coenzyme Q10 (CO Q 10) 100 MG CAPS Take by mouth daily.   Yes [provider]  fenofibrate 160 MG tablet Take 1 tablet (160 mg total) by mouth daily. 11/28/18  Yes Leone Haven, MD  lisinopril-hydrochlorothiazide (PRINZIDE,ZESTORETIC) 20-12.5 MG tablet Take 1 tablet by mouth daily. 11/28/18  Yes Leone Haven, MD  Multiple Vitamin (MULTIVITAMIN) capsule Take 1 capsule daily by mouth.   Yes [provider]  Omega-3 Fatty Acids (FISH OIL) 1200 MG CPDR Take by mouth. Takes 4 tablets daily.   Yes [provider]    Family History  Problem Relation Age of Onset  . Hyperlipidemia Mother   . Hypertension Mother   . Arthritis Mother   . Stroke Father   . Hyperlipidemia Father   . Hypertension Father   . Alcohol abuse Father   . Arthritis Father   . Colon cancer Maternal Grandfather   . Prostate cancer Neg Hx   . Bladder Cancer Neg Hx   . Kidney cancer Neg Hx      Social History   Tobacco Use  . Smoking status: Never Smoker  . Smokeless tobacco: Never Used  Substance Use Topics  . Alcohol use: Yes    Comment: occassionally  . Drug use: No    Allergies as of 12/24/2018 - Review Complete 12/24/2018  Allergen Reaction Noted  . Latex Itching and Rash 11/04/2018    Review of Systems:    All systems reviewed and negative except where noted in HPI.  Physical Exam:  BP 133/85   Pulse 63   Ht 6\' 1"  (1.854 m)   Wt (!) 333 lb 3.2 oz (151.1 kg)   BMI 43.96 kg/m  No LMP for male patient. General:   Alert,  Well-developed, well-nourished, pleasant and cooperative in NAD Head:  Normocephalic and atraumatic. Eyes:  Sclera clear, no icterus.   Conjunctiva pink. Ears:  Normal auditory acuity. Nose:  No deformity, discharge, or lesions. Mouth:  No deformity or lesions,oropharynx pink & moist. Neck:  Supple; no masses or thyromegaly. Lungs:  Respirations even and unlabored.  Clear throughout to auscultation.   No wheezes, crackles, or rhonchi. No acute distress. Heart:  Regular rate and rhythm; no murmurs, clicks, rubs,  or gallops. Abdomen:  Normal bowel sounds.  No bruits.  Soft, non-tender and non-distended without masses, hepatosplenomegaly or hernias noted.  No guarding or rebound tenderness.  Negative Carnett sign.   Rectal:  Deferred.  Msk:  Symmetrical without gross deformities.  Good, equal movement & strength bilaterally. Pulses:  Normal pulses noted. Extremities:  No clubbing or edema.  No cyanosis. Neurologic:  Alert and oriented x3;  grossly normal neurologically. Skin:  Intact without significant lesions or rashes.  No jaundice. Lymph Nodes:  No significant cervical adenopathy. Psych:  Alert and cooperative. Normal mood and affect.  Imaging Studies: No results found.  Assessment and Plan:   Devon Miranda is a 48 y.o. y/o male who comes in after having a bout of diverticulitis in November.  The patient was treated with antibiotics and states that he is pain-free at the present time.  The patient did have a CT scan that showed him to have acute diverticulitis.  The patient will be set up for colonoscopy due to having a attack of diverticulitis and to rule out any colonic pathology other than diverticulosis.  We will also give [place] an idea of the extent of his diverticulosis and which parts of the colon it involves.  The patient and his wife have been explained the plan and agree with it.  Lucilla Lame, MD. Mar[facility] Regal    Note: This dictation was prepared with Dragon dictation along with smaller phrase technology. Any transcriptional errors that result from this process are unintentional.

## 2018-12-30 ENCOUNTER — Encounter: Payer: Self-pay | Admitting: Anesthesiology

## 2018-12-30 ENCOUNTER — Other Ambulatory Visit: Payer: Self-pay

## 2018-12-30 ENCOUNTER — Encounter: Payer: Self-pay | Admitting: *Deleted

## 2018-12-30 NOTE — Anesthesia Preprocedure Evaluation (Deleted)
Anesthesia Evaluation  Patient identified by MRN, date of birth, ID band Patient awake    Reviewed: Allergy & Precautions, H&P , NPO status , Patient's Chart, lab work & pertinent test results  Airway        Dental   Pulmonary shortness of breath, sleep apnea (Severe OSA with an AHI 41.  For colonoscopy ok- instructed to bring CPAP.) and Continuous Positive Airway Pressure Ventilation ,           Cardiovascular hypertension,      Neuro/Psych    GI/Hepatic   Endo/Other    Renal/GU      Musculoskeletal   Abdominal   Peds  Hematology   Anesthesia Other Findings   Reproductive/Obstetrics negative OB ROS                             Anesthesia Physical Anesthesia Plan Anesthesia Quick Evaluation

## 2019-01-01 NOTE — Discharge Instructions (Signed)
General Anesthesia, Adult, Care After  This sheet gives you information about how to care for yourself after your procedure. Your health care provider may also give you more specific instructions. If you have problems or questions, contact your health care provider.  What can I expect after the procedure?  After the procedure, the following side effects are common:  Pain or discomfort at the IV site.  Nausea.  Vomiting.  Sore throat.  Trouble concentrating.  Feeling cold or chills.  Weak or tired.  Sleepiness and fatigue.  Soreness and body aches. These side effects can affect parts of the body that were not involved in surgery.  Follow these instructions at home:    For at least 24 hours after the procedure:  Have a responsible adult stay with you. It is important to have someone help care for you until you are awake and alert.  Rest as needed.  Do not:  Participate in activities in which you could fall or become injured.  Drive.  Use heavy machinery.  Drink alcohol.  Take sleeping pills or medicines that cause drowsiness.  Make important decisions or sign legal documents.  Take care of children on your own.  Eating and drinking  Follow any instructions from your health care provider about eating or drinking restrictions.  When you feel hungry, start by eating small amounts of foods that are soft and easy to digest (bland), such as toast. Gradually return to your regular diet.  Drink enough fluid to keep your urine pale yellow.  If you vomit, rehydrate by drinking water, juice, or clear broth.  General instructions  If you have sleep apnea, surgery and certain medicines can increase your risk for breathing problems. Follow instructions from your health care provider about wearing your sleep device:  Anytime you are sleeping, including during daytime naps.  While taking prescription pain medicines, sleeping medicines, or medicines that make you drowsy.  Return to your normal activities as told by your health care  provider. Ask your health care provider what activities are safe for you.  Take over-the-counter and prescription medicines only as told by your health care provider.  If you smoke, do not smoke without supervision.  Keep all follow-up visits as told by your health care provider. This is important.  Contact a health care provider if:  You have nausea or vomiting that does not get better with medicine.  You cannot eat or drink without vomiting.  You have pain that does not get better with medicine.  You are unable to pass urine.  You develop a skin rash.  You have a fever.  You have redness around your IV site that gets worse.  Get help right away if:  You have difficulty breathing.  You have chest pain.  You have blood in your urine or stool, or you vomit blood.  Summary  After the procedure, it is common to have a sore throat or nausea. It is also common to feel tired.  Have a responsible adult stay with you for the first 24 hours after general anesthesia. It is important to have someone help care for you until you are awake and alert.  When you feel hungry, start by eating small amounts of foods that are soft and easy to digest (bland), such as toast. Gradually return to your regular diet.  Drink enough fluid to keep your urine pale yellow.  Return to your normal activities as told by your health care provider. Ask your health care   provider what activities are safe for you.  This information is not intended to replace advice given to you by your health care provider. Make sure you discuss any questions you have with your health care provider.  Document Released: 03/11/2001 Document Revised: 07/19/2017 Document Reviewed: 07/19/2017  Elsevier Interactive Patient Education  2019 [employer]

## 2019-01-02 ENCOUNTER — Ambulatory Visit
Admission: RE | Admit: 2019-01-02 | Payer: No Typology Code available for payment source | Source: Home / Self Care | Admitting: Gastroenterology

## 2019-01-02 HISTORY — DX: Sleep apnea, unspecified: G47.30

## 2019-01-02 HISTORY — DX: Unspecified osteoarthritis, unspecified site: M19.90

## 2019-01-02 SURGERY — COLONOSCOPY WITH PROPOFOL
Anesthesia: Choice

## 2019-01-06 ENCOUNTER — Ambulatory Visit (INDEPENDENT_AMBULATORY_CARE_PROVIDER_SITE_OTHER): Payer: Self-pay | Admitting: Physician Assistant

## 2019-01-06 DIAGNOSIS — Z23 Encounter for immunization: Secondary | ICD-10-CM

## 2019-01-06 NOTE — Patient Instructions (Signed)
Influenza (Flu) Vaccine (Inactivated or Recombinant): What You Need to Know  1. Why get vaccinated?  Influenza vaccine can prevent influenza (flu).  Flu is a contagious disease that spreads around the [place] every year, usually between October and May. Anyone can get the flu, but it is more dangerous for some people. Infants and young children, people 49 years of age and older, pregnant women, and people with certain health conditions or a weakened immune system are at greatest risk of flu complications.  Pneumonia, bronchitis, sinus infections and ear infections are examples of flu-related complications. If you have a medical condition, such as heart disease, cancer or diabetes, flu can make it worse.  Flu can cause fever and chills, sore throat, muscle aches, fatigue, cough, headache, and runny or stuffy nose. Some people may have vomiting and diarrhea, though this is more common in children than adults.  Each year thousands of people in the [place] die from flu, and many more are hospitalized. Flu vaccine prevents millions of illnesses and flu-related visits to the doctor each year.  2. Influenza vaccine  CDC recommends everyone 6 months of age and older get vaccinated every flu season. Children 6 months through 8 years of age may need 2 doses during a single flu season. Everyone else needs only 1 dose each flu season.  It takes about 2 weeks for protection to develop after vaccination.  There are many flu viruses, and they are always changing. Each year a new flu vaccine is made to protect against three or four viruses that are likely to cause disease in the upcoming flu season. Even when the vaccine doesn't exactly match these viruses, it may still provide some protection.  Influenza vaccine does not cause flu.  Influenza vaccine may be given at the same time as other vaccines.  3. Talk with your health care provider  Tell your vaccine provider if the person getting the vaccine:  · Has had an  allergic reaction after a previous dose of influenza vaccine, or has any severe, life-threatening allergies.  · Has ever had Guillain-Barré Syndrome (also called GBS).  In some cases, your health care provider may decide to postpone influenza vaccination to a future visit.  People with minor illnesses, such as a cold, may be vaccinated. People who are moderately or severely ill should usually wait until they recover before getting influenza vaccine.  Your health care provider can give you more information.  4. Risks of a vaccine reaction  · Soreness, redness, and swelling where shot is given, fever, muscle aches, and headache can happen after influenza vaccine.  · There may be a very small increased risk of Guillain-Barré Syndrome (GBS) after inactivated influenza vaccine (the flu shot).  Young children who get the flu shot along with pneumococcal vaccine (PCV13), and/or DTaP vaccine at the same time might be slightly more likely to have a seizure caused by fever. Tell your health care provider if a child who is getting flu vaccine has ever had a seizure.  People sometimes faint after medical procedures, including vaccination. Tell your provider if you feel dizzy or have vision changes or ringing in the ears.  As with any medicine, there is a very remote chance of a vaccine causing a severe allergic reaction, other serious injury, or death.  5. What if there is a serious problem?  An allergic reaction could occur after the vaccinated person leaves the clinic. If you see signs of a severe allergic reaction (hives, swelling   of the face and throat, difficulty breathing, a fast heartbeat, dizziness, or weakness), call 9-1-1 and get the person to the nearest hospital.  For other signs that concern you, call your health care provider.  Adverse reactions should be reported to the Vaccine Adverse Event Reporting System (VAERS). Your health care provider will usually file this report, or you can do it yourself. Visit the  VAERS website at www.vaers.hhs.gov or call 1-800-822-7967.VAERS is only for reporting reactions, and VAERS staff do not give medical advice.  6. The National Vaccine Injury Compensation Program  The National Vaccine Injury Compensation Program (VICP) is a federal program that was created to compensate people who may have been injured by certain vaccines. Visit the VICP website at www.hrsa.gov/vaccinecompensation or call 1-800-338-2382 to learn about the program and about filing a claim. There is a time limit to file a claim for compensation.  7. How can I learn more?  · Ask your healthcare provider.  · Call your local or state health department.  · Contact the Centers for Disease Control and Prevention (CDC):  ? Call 1-800-232-4636 (1-800-CDC-INFO) or  ? Visit CDC's www.cdc.gov/flu  Vaccine Information Statement (Interim) Inactivated Influenza Vaccine (07/31/2018)  This information is not intended to replace advice given to you by your health care provider. Make sure you discuss any questions you have with your health care provider.  Document Released: 09/27/2006 Document Revised: 08/04/2018 Document Reviewed: 08/04/2018  Elsevier Interactive Patient Education © 2019 [employer] Inc.

## 2019-01-06 NOTE — Progress Notes (Signed)
Pt presents here today for visit to receive Influenza vaccine. Allergies reviewed, vaccine given IM right deltoid, vaccine information statement provided, Patient waited 15 min  tolerated well.   Vassie Loll, [place] Registered Medical Assistant [place] [phone]

## 2019-02-24 ENCOUNTER — Ambulatory Visit (INDEPENDENT_AMBULATORY_CARE_PROVIDER_SITE_OTHER): Payer: Self-pay | Admitting: Physician Assistant

## 2019-02-24 VITALS — BP 130/90 | HR 72 | Temp 98.1°F | Resp 16 | Wt 324.0 lb

## 2019-02-24 DIAGNOSIS — J302 Other seasonal allergic rhinitis: Secondary | ICD-10-CM

## 2019-02-24 DIAGNOSIS — J0100 Acute maxillary sinusitis, unspecified: Secondary | ICD-10-CM

## 2019-02-24 MED ORDER — MONTELUKAST SODIUM 10 MG PO TABS: 10.0000 mg | ORAL_TABLET | Freq: Every day | ORAL | 0 refills | Status: DC

## 2019-02-24 MED ORDER — KETOTIFEN FUMARATE 0.025 % OP SOLN: 1.0000 [drp] | Freq: Two times a day (BID) | OPHTHALMIC | 0 refills | Status: DC

## 2019-02-24 MED ORDER — AZELASTINE HCL 0.1 % NA SOLN: 1.0000 | Freq: Two times a day (BID) | NASAL | 0 refills | Status: DC

## 2019-02-24 MED ORDER — AMOXICILLIN-POT CLAVULANATE 875-125 MG PO TABS: 1.0000 | ORAL_TABLET | Freq: Two times a day (BID) | ORAL | 0 refills | Status: AC

## 2019-02-24 NOTE — Patient Instructions (Addendum)
Thank you for choosing [facility] for your health care needs.  You have been diagnosed with sinusitis (a sinus infection). Believe a contributing factor, is your seasonal allergies.  Take medications as prescribed: Meds ordered this encounter  Medications  . amoxicillin-clavulanate (AUGMENTIN) 875-125 MG tablet    Sig: Take 1 tablet by mouth 2 (two) times daily for 7 days.    Dispense:  14 tablet    Refill:  0    Order Specific Question:   Supervising Provider    Answer:   MILLER, BRIAN [[id number]]  . ketotifen (ZADITOR) 0.025 % ophthalmic solution    Sig: Place 1 drop into both eyes 2 (two) times daily.    Dispense:  10 mL    Refill:  0    Order Specific Question:   Supervising Provider    Answer:   MILLER, BRIAN [[id number]]  . montelukast (SINGULAIR) 10 MG tablet    Sig: Take 1 tablet (10 mg total) by mouth at bedtime.    Dispense:  30 tablet    Refill:  0    Order Specific Question:   Supervising Provider    Answer:   MILLER, BRIAN [[id number]]  . azelastine (ASTELIN) 0.1 % nasal spray    Sig: Place 1 spray into both nostrils 2 (two) times daily. Use in each nostril as directed    Dispense:  30 mL    Refill:  0    Order Specific Question:   Supervising Provider    Answer:   MILLER, BRIAN [[id number]]   Increase fluids; water, Gatorade, hot tea with lemon/honey. Rest. May use saline nasal spray and/or NettiPot. Continue to use daily antihistamine: Claritin, Allegra, or Zyrtec.  Follow-up with family physician or ENT (ear, nose and throat specialist) or allergist in one week if symptoms not improving. Follow-up immediately with eye doctor if eye symptoms not improving.  Hope you feel better soon!  Sinusitis, Adult Sinusitis is soreness and swelling (inflammation) of your sinuses. Sinuses are hollow spaces in the bones around your face. They are located:  Around your eyes.  In the middle of your forehead.  Behind your nose.  In your cheekbones. Your sinuses and nasal passages are lined  with a fluid called mucus. Mucus drains out of your sinuses. Swelling can trap mucus in your sinuses. This lets germs (bacteria, virus, or fungus) grow, which leads to infection. Most of the time, this condition is caused by a virus. What are the causes? This condition is caused by:  Allergies.  Asthma.  Germs.  Things that block your nose or sinuses.  Growths in the nose (nasal polyps).  Chemicals or irritants in the air.  Fungus (rare). What increases the risk? You are more likely to develop this condition if:  You have a weak body defense system (immune system).  You do a lot of swimming or diving.  You use nasal sprays too much.  You smoke. What are the signs or symptoms? The main symptoms of this condition are pain and a feeling of pressure around the sinuses. Other symptoms include:  Stuffy nose (congestion).  Runny nose (drainage).  Swelling and warmth in the sinuses.  Headache.  Toothache.  A cough that may get worse at night.  Mucus that collects in the throat or the back of the nose (postnasal drip).  Being unable to smell and taste.  Being very tired (fatigue).  A fever.  Sore throat.  Bad breath. How is this diagnosed? This condition is diagnosed based on:  Your symptoms.  Your medical history.  A physical exam.  Tests to find out if your condition is short-term (acute) or long-term (chronic). Your doctor may: ? Check your nose for growths (polyps). ? Check your sinuses using a tool that has a light (endoscope). ? Check for allergies or germs. ? Do imaging tests, such as an MRI or CT scan. How is this treated? Treatment for this condition depends on the cause and whether it is short-term or long-term.  If caused by a virus, your symptoms should go away on their own within 10 days. You may be given medicines to relieve symptoms. They include: ? Medicines that shrink swollen tissue in the nose. ? Medicines that treat allergies  (antihistamines). ? A spray that treats swelling of the nostrils. ? Rinses that help get rid of thick mucus in your nose (nasal saline washes).  If caused by bacteria, your doctor may wait to see if you will get better without treatment. You may be given antibiotic medicine if you have: ? A very bad infection. ? A weak body defense system.  If caused by growths in the nose, you may need to have surgery. Follow these instructions at home: Medicines  Take, use, or apply over-the-counter and prescription medicines only as told by your doctor. These may include nasal sprays.  If you were prescribed an antibiotic medicine, take it as told by your doctor. Do not stop taking the antibiotic even if you start to feel better. Hydrate and humidify   Drink enough water to keep your pee (urine) pale yellow.  Use a cool mist humidifier to keep the humidity level in your home above 50%.  Breathe in steam for 10-15 minutes, 3-4 times a day, or as told by your doctor. You can do this in the bathroom while a hot shower is running.  Try not to spend time in cool or dry air. Rest  Rest as much as you can.  Sleep with your head raised (elevated).  Make sure you get enough sleep each night. General instructions   Put a warm, moist washcloth on your face 3-4 times a day, or as often as told by your doctor. This will help with discomfort.  Wash your hands often with soap and water. If there is no soap and water, use hand sanitizer.  Do not smoke. Avoid being around people who are smoking (secondhand smoke).  Keep all follow-up visits as told by your doctor. This is important. Contact a doctor if:  You have a fever.  Your symptoms get worse.  Your symptoms do not get better within 10 days. Get help right away if:  You have a very bad headache.  You cannot stop throwing up (vomiting).  You have very bad pain or swelling around your face or eyes.  You have trouble seeing.  You feel  confused.  Your neck is stiff.  You have trouble breathing. Summary  Sinusitis is swelling of your sinuses. Sinuses are hollow spaces in the bones around your face.  This condition is caused by tissues in your nose that become inflamed or swollen. This traps germs. These can lead to infection.  If you were prescribed an antibiotic medicine, take it as told by your doctor. Do not stop taking it even if you start to feel better.  Keep all follow-up visits as told by your doctor. This is important. This information is not intended to replace advice given to you by your health care provider. Make sure  you discuss any questions you have with your health care provider. Document Released: 05/21/2008 Document Revised: 05/05/2018 Document Reviewed: 05/05/2018 Elsevier Interactive Patient Education  2019 [employer].

## 2019-02-24 NOTE — Progress Notes (Signed)
Patient ID: Devon Miranda DOB: 05-06-1971 AGE: 48 y.o. MRN: [id number]   PCP: Leone Haven, MD   Chief Complaint:  Chief Complaint  Patient presents with  . Cough    x3wk  . Nasal Congestion    x3wk  . Conjunctivitis    x1wk     Subjective:    HPI:  Devon Miranda is a 48 y.o. male presents for evaluation  Chief Complaint  Patient presents with  . Cough    x3wk  . Nasal Congestion    x3wk  . Conjunctivitis    x44wk    48 year old male presents to [facility] with three week history of URI symptoms. Began with sneezing, rhinorrhea, nasal congestion, and dry cough from PND. Patient suspected due to seasonal allergies. Developed worsening sinus maxillary sinus pressure/congestion. Cough persistent during the day. Nasal congestion worse at night; causing difficulty breathing through nose. Patient has tried Mucinex and Nyquil with minimal relief. Started Claritin few days in to symptoms; has been taking daily with no improvement. Patient states 7-10 days ago he began waking up with eyes crusted shut. Will wipe away crusted discharge; eyes then burn and have pruritis throughout the day. Has been using Nafcon eye drops with moderate improvement. Denies eye injury/trauma, change in vision, foreign body sensation, photosensitivity, pain with eye movement. Patient does not wear contact lenses. Patient admits to frequently rubbing his eyes; has developed bilateral lower eyelid edema. Denies fever, chills, headache, malaise, ear pain, tinnitus, neck pain, sore throat, chest pain, SOB, wheezing. Patient with known allergies. States getting worse each year. Most severe in spring season. Has not seen an allergist.  A limited review of symptoms was performed, pertinent positives and negatives as mentioned in HPI.  The following portions of the patient's history were reviewed and updated as appropriate: allergies, current medications and past medical history.  Patient Active  Problem List   Diagnosis Date Noted  . Acid reflux 12/24/2018  . Chronic low back pain 11/28/2018  . Diverticulitis 11/19/2018  . Chronic pain of right knee 08/06/2018  . Encounter for general adult medical examination with abnormal findings 06/06/2018  . Scrotal pain 10/29/2017  . Pain in right testicle 10/23/2017  . OSA (obstructive sleep apnea) 10/23/2017  . Lightheadedness 10/07/2017  . Decreased hearing of left ear 10/07/2017  . Hypertriglyceridemia 10/07/2017  . History of hypoglycemia 10/07/2017  . PVC (premature ventricular contraction) 11/06/2013  . Anxiety 11/06/2013  . Bradycardia 09/21/2013  . Dyspnea 04/24/2012  . Near syncope 04/24/2012  . Hypertension 04/24/2012    Allergies  Allergen Reactions  . Latex Itching and Rash    "cheap halloween masks on my face". No problem with gloves, etc.    Current Outpatient Medications on File Prior to Visit  Medication Sig Dispense Refill  . Albuterol Sulfate (PROAIR RESPICLICK) 009 (90 Base) MCG/ACT AEPB Inhale 1-2 puffs into the lungs every 6 (six) hours as needed. 1 each 5  . Coenzyme Q10 (CO Q 10) 100 MG CAPS Take by mouth daily.    . fenofibrate 160 MG tablet Take 1 tablet (160 mg total) by mouth daily. 90 tablet 1  . lisinopril-hydrochlorothiazide (PRINZIDE,ZESTORETIC) 20-12.5 MG tablet Take 1 tablet by mouth daily. 90 tablet 1  . Multiple Vitamin (MULTIVITAMIN) capsule Take 1 capsule daily by mouth.    . Omega-3 Fatty Acids (FISH OIL) 1200 MG CPDR Take by mouth. Takes 4 tablets daily.     No current facility-administered medications on file prior to visit.  Objective:   Vitals:   02/24/19 1128  BP: 130/90  Pulse: 72  Resp: 16  Temp: 98.1 F (36.7 C)  SpO2: 96%     Wt Readings from Last 3 Encounters:  02/24/19 (!) 324 lb (147 kg)  12/24/18 (!) 333 lb 3.2 oz (151.1 kg)  11/28/18 (!) 323 lb (146.5 kg)    Vision: Bilateral 20/40 Left 20/50 Right 20/50 Uncorrected. Patient states he owns glasses;  not wearing today.  Physical Exam:   General Appearance:  Patient sitting comfortably on examination table. Conversational. Kermit Balo self-historian. In no acute distress. Afebrile.   Head:  Normocephalic, without obvious abnormality, atraumatic  Eyes:  PERRL, corneas clear, EOM's intact. Bilateral bulbar conjunctiva with very minimal injection. No diffuse erythema. No chemosis. No perilimbal flushing. No visible foreign body. Mild bilateral allergic shiners. Upper and lower eyelids without erythema. No photosensitivity.   Ears:  Left ear canal WNL. No erythema or edema. No open wound. No visible purulent drainage. No tenderness with palpation over left tragus or with manipulation of left auricle. No visible erythema or edema of left mastoid. No tenderness with palpation over left mastoid. Right ear canal WNL. No erythema or edema. No open wound. No visible purulent drainage. No tenderness with palpation over right tragus or with manipulation of right auricle. No visible erythema or edema of right mastoid. No tenderness with palpation over right mastoid. Left TM WNL. Good light reflex. Visible landmarks. No erythema. No injection. No bulging or retraction. No visible perforation. No serous effusion. No visible purulent effusion. No tympanostomy tube. No scar tissue. Right TM WNL. Good light reflex. Visible landmarks. No erythema. No injection. No bulging or retraction. No visible perforation. No serous effusion. No visible purulent effusion. No tympanostomy tube. No scar tissue.  Nose: Nares normal. Deviated septum (narros left naris - patient states fractured nose in high school). No visible polyps. Nasal mucosa with faint erythema, minimal edema, scant clear rhinorrhea. Mild tenderness with palpation over bilateral maxillary sinuses.  Throat: Lips, mucosa, and tongue normal; teeth and gums normal. Throat reveals no erythema. No postnasal drip. No visible cobblestoning. Tonsils with no enlargement or  exudate. Uvula with minimal erythema at inferior portion. No edema.  Neck: Supple, symmetrical, trachea midline, mild bilateral anterior cervical lymphadenopathy.  Lungs:   Clear to auscultation bilaterally, respirations unlabored. Good aeration. No rales, rhonchi, crackles or wheezing.  Heart:  Regular rate and rhythm, S1 and S2 normal, no murmur, rub, or gallop  Extremities: Extremities normal, atraumatic, no cyanosis or edema  Pulses: 2+ and symmetric  Skin: Skin color, texture, turgor normal, no rashes or lesions  Lymph nodes: Cervical, supraclavicular, and axillary nodes normal  Neurologic: Normal    Assessment & Plan:    Exam findings, diagnosis etiology and medication use and indications reviewed with patient. Follow-Up and discharge instructions provided. No emergent/urgent issues found on exam.  Patient education was provided.   Patient verbalized understanding of information provided and agrees with plan of care (POC), all questions answered. The patient is advised to call or return to clinic if condition does not see an improvement in symptoms, or to seek the care of the closest emergency department if condition worsens with the below plan.    1. Acute non-recurrent maxillary sinusitis - amoxicillin-clavulanate (AUGMENTIN) 875-125 MG tablet; Take 1 tablet by mouth 2 (two) times daily for 7 days.  Dispense: 14 tablet; Refill: 0  2. Seasonal allergies - ketotifen (ZADITOR) 0.025 % ophthalmic solution; Place 1 drop into both eyes  2 (two) times daily.  Dispense: 10 mL; Refill: 0 - montelukast (SINGULAIR) 10 MG tablet; Take 1 tablet (10 mg total) by mouth at bedtime.  Dispense: 30 tablet; Refill: 0 - azelastine (ASTELIN) 0.1 % nasal spray; Place 1 spray into both nostrils 2 (two) times daily. Use in each nostril as directed  Dispense: 30 mL; Refill: 0  Patient with three week history of nasal congestion, sinus pressure/congestion, and PND. VSS, afebrile, in no acute distress. Due to  duration of symptoms, will treat with antibiotic (Augmentin bid x 7 days). However, discussed with patient, possibility persistent symptoms due to uncontrolled seasonal allergies (including eye irritation). Advised continuation of OTC antihistamine. However, in addition, prescribed Zaditor eye drops, Astelin nasal spray, and Singulair. Advised patient follow-up with PCP, ENT, or allergist in one week if symptoms not improving. Advised patient f/u with eye doctor immediately if symptoms worsen. Patient agreed with plan.   Darlin Priestly, MHS, PA-C Montey Hora, MHS, PA-C Advanced Practice Provider [facility]  [address] [facility], [place], [place] [address] (p):  [phone] [email or web address]

## 2019-02-26 ENCOUNTER — Telehealth: Payer: Self-pay | Admitting: Emergency Medicine

## 2019-02-26 NOTE — Telephone Encounter (Signed)
Left message following up on visit with [facility] 

## 2019-03-06 ENCOUNTER — Other Ambulatory Visit: Payer: Self-pay | Admitting: Physician Assistant

## 2019-03-06 DIAGNOSIS — J302 Other seasonal allergic rhinitis: Secondary | ICD-10-CM

## 2019-06-01 ENCOUNTER — Ambulatory Visit (INDEPENDENT_AMBULATORY_CARE_PROVIDER_SITE_OTHER): Payer: No Typology Code available for payment source | Admitting: Family Medicine

## 2019-06-01 ENCOUNTER — Other Ambulatory Visit: Payer: Self-pay

## 2019-06-01 ENCOUNTER — Encounter: Payer: Self-pay | Admitting: Family Medicine

## 2019-06-01 DIAGNOSIS — R0789 Other chest pain: Secondary | ICD-10-CM

## 2019-06-01 DIAGNOSIS — G4733 Obstructive sleep apnea (adult) (pediatric): Secondary | ICD-10-CM | POA: Diagnosis not present

## 2019-06-01 DIAGNOSIS — J019 Acute sinusitis, unspecified: Secondary | ICD-10-CM

## 2019-06-01 DIAGNOSIS — J329 Chronic sinusitis, unspecified: Secondary | ICD-10-CM | POA: Insufficient documentation

## 2019-06-01 DIAGNOSIS — I10 Essential (primary) hypertension: Secondary | ICD-10-CM | POA: Diagnosis not present

## 2019-06-01 DIAGNOSIS — J302 Other seasonal allergic rhinitis: Secondary | ICD-10-CM

## 2019-06-01 DIAGNOSIS — J309 Allergic rhinitis, unspecified: Secondary | ICD-10-CM

## 2019-06-01 MED ORDER — MONTELUKAST SODIUM 10 MG PO TABS: 10.0000 mg | ORAL_TABLET | Freq: Every day | ORAL | 0 refills | Status: DC

## 2019-06-01 NOTE — Assessment & Plan Note (Signed)
Adequately controlled when he was on Singulair.  We will refill this.  Discussed that he could take it seasonally or daily depending on when he has symptoms.

## 2019-06-01 NOTE — Progress Notes (Signed)
Virtual Visit via video Note  This visit type was conducted due to national recommendations for restrictions regarding the COVID-19 pandemic (e.g. social distancing).  This format is felt to be most appropriate for this patient at this time.  All issues noted in this document were discussed and addressed.  No physical exam was performed (except for noted visual exam findings with Video Visits).   I connected with Devon Miranda today at  9:00 AM EDT by a video enabled telemedicine application and verified that I am speaking with the correct person using two identifiers. Location patient: home Location provider: work Persons participating in the virtual visit: patient, provider  I discussed the limitations, risks, security and privacy concerns of performing an evaluation and management service by telephone and the availability of in person appointments. I also discussed with the patient that there may be a patient responsible charge related to this service. The patient expressed understanding and agreed to proceed.  Reason for visit: follow-up  HPI: Hypertension: Typically 130s over 80s.  Taking lisinopril/HCTZ.  No chest pressure, shortness of breath, or edema.  Respiratory illness/allergic rhinitis: Patient was sick with cough, fever, and chills back in January and February.  He went to [facility] in March and was treated with Augmentin and also started on Singulair for allergy symptoms.  He does note the Singulair helped significantly with none itchy watery eyes and some cough that he had.  He has had no fever since March.  He is taking Claritin currently though notes this makes him somewhat drowsy.  Musculoskeletal chest pain: Patient notes over the last week or so he has been carrying bags of concrete and cinderblocks.  He has had a discomfort in his left chest in an area the size of about a finger just adjacent to his sternum that hurts when he presses on it.  Describes it as a sharp  discomfort.  There is no discomfort on breathing.  There was no specific injury.  There is no exertional component.  No radiation.  This has improved.  OSA: He is back on his CPAP.  He notes he does not use it every night though he does notice quite a bit of difference when he is able to use it for 3-5 nights in a row.  He has had issues tolerating the masks and they got him several new masks to try.  His follow-up visit with pulmonology was delayed given the COVID-19 pandemic.   ROS: See pertinent positives and negatives per HPI.  Past Medical History:  Diagnosis Date  . Arthritis    knees  . Essential hypertension, benign   . Near syncope 04/24/2012  . Other and unspecified hyperlipidemia   . Other testicular hypofunction   . PVC (premature ventricular contraction) 11/06/2013  . Sleep apnea    CPAP  . Unspecified essential hypertension     Past Surgical History:  Procedure Laterality Date  . arthroscopic   1991   left knee surgery  . [place]  . WISDOM TOOTH EXTRACTION      Family History  Problem Relation Age of Onset  . Hyperlipidemia Mother   . Hypertension Mother   . Arthritis Mother   . Stroke Father   . Hyperlipidemia Father   . Hypertension Father   . Alcohol abuse Father   . Arthritis Father   . Colon cancer Maternal Grandfather   . Prostate cancer Neg Hx   . Bladder Cancer Neg  Hx   . Kidney cancer Neg Hx     SOCIAL HX: Non-smoker.   Current Outpatient Medications:  .  Albuterol Sulfate (PROAIR RESPICLICK) 170 (90 Base) MCG/ACT AEPB, Inhale 1-2 puffs into the lungs every 6 (six) hours as needed., Disp: 1 each, Rfl: 5 .  Coenzyme Q10 (CO Q 10) 100 MG CAPS, Take by mouth daily., Disp: , Rfl:  .  fenofibrate 160 MG tablet, Take 1 tablet (160 mg total) by mouth daily., Disp: 90 tablet, Rfl: 1 .  lisinopril-hydrochlorothiazide (PRINZIDE,ZESTORETIC) 20-12.5 MG tablet, Take 1 tablet by mouth daily., Disp: 90 tablet, Rfl:  1 .  montelukast (SINGULAIR) 10 MG tablet, Take 1 tablet (10 mg total) by mouth at bedtime., Disp: 30 tablet, Rfl: 0 .  Multiple Vitamin (MULTIVITAMIN) capsule, Take 1 capsule daily by mouth., Disp: , Rfl:  .  Omega-3 Fatty Acids (FISH OIL) 1200 MG CPDR, Take by mouth. Takes 4 tablets daily., Disp: , Rfl:  .  azelastine (ASTELIN) 0.1 % nasal spray, Place 1 spray into both nostrils 2 (two) times daily. Use in each nostril as directed (Patient not taking: Reported on 06/01/2019), Disp: 30 mL, Rfl: 0 .  ketotifen (ZADITOR) 0.025 % ophthalmic solution, Place 1 drop into both eyes 2 (two) times daily. (Patient not taking: Reported on 06/01/2019), Disp: 10 mL, Rfl: 0  EXAM:  VITALS per patient if applicable: None.  GENERAL: alert, oriented, appears well and in no acute distress  HEENT: atraumatic, conjunttiva clear, no obvious abnormalities on inspection of external nose and ears  NECK: normal movements of the head and neck  LUNGS: on inspection no signs of respiratory distress, breathing rate appears normal, no obvious gross SOB, gasping or wheezing  CV: no obvious cyanosis  MS: moves all visible extremities without noticeable abnormality  PSYCH/NEURO: pleasant and cooperative, no obvious depression or anxiety, speech and thought processing grossly intact  ASSESSMENT AND PLAN:  Discussed the following assessment and plan:  Hypertension Stable.  Continue current medication.  Check BMP.  OSA (obstructive sleep apnea) I encouraged patient to contact pulmonology to schedule follow-up.  He will continue his CPAP.  Musculoskeletal chest pain Symptoms are consistent with musculoskeletal chest pain.  He will monitor and if not continue to improve he will let us know.  Discussed reasons to seek medical attention.  Sinusitis Symptoms have resolved.  He will monitor for recurrence.  Allergic rhinitis Adequately controlled when he was on Singulair.  We will refill this.  Discussed that he  could take it seasonally or daily depending on when he has symptoms.  [place] office staff will contact the patient to schedule lab work for sometime in the next month and follow-up in 6 months.  Social distancing precautions and sick precautions given regarding COVID-19.   I discussed the assessment and treatment plan with the patient. The patient was provided an opportunity to ask questions and all were answered. The patient agreed with the plan and demonstrated an understanding of the instructions.   The patient was advised to call back or seek an in-person evaluation if the symptoms worsen or if the condition fails to improve as anticipated.   Tommi Rumps, MD

## 2019-06-01 NOTE — Assessment & Plan Note (Signed)
I encouraged patient to contact pulmonology to schedule follow-up.  He will continue his CPAP.

## 2019-06-01 NOTE — Assessment & Plan Note (Signed)
Stable.  Continue current medication.  Check BMP.

## 2019-06-01 NOTE — Assessment & Plan Note (Signed)
Symptoms have resolved.  He will monitor for recurrence.

## 2019-06-01 NOTE — Assessment & Plan Note (Signed)
Symptoms are consistent with musculoskeletal chest pain.  He will monitor and if not continue to improve he will let us know.  Discussed reasons to seek medical attention.

## 2019-06-08 ENCOUNTER — Encounter: Payer: No Typology Code available for payment source | Admitting: Family Medicine

## 2019-06-11 ENCOUNTER — Other Ambulatory Visit: Payer: Self-pay

## 2019-06-15 ENCOUNTER — Telehealth: Payer: Self-pay | Admitting: Family Medicine

## 2019-06-15 ENCOUNTER — Ambulatory Visit: Payer: No Typology Code available for payment source | Admitting: Family Medicine

## 2019-06-15 ENCOUNTER — Other Ambulatory Visit: Payer: Self-pay

## 2019-06-15 NOTE — Telephone Encounter (Signed)
Discussed with Juliann Pulse. Protocol was being followed of not seeing patients in the office with complaints that flag in the [place] screening questionnaire. I will forward to [facility] for her to review for patient dismissal given language and patient behavior as discussed earlier today.

## 2019-06-15 NOTE — Telephone Encounter (Signed)
Patient came into office was screened for COVID answered yes to abdominal pain and to body aches,  Spoke with PCP was advised we could do virtual or telephone, but could not do a physical today , patient said "what the F..k you mean , I cannot be seen these are  Chronic issues," I explained these are screening questions put in place by [facility] it is protocol, I am sorry sir " what the f..k are you sorry for," that you are upset, " you can go F..K yourself and F..K the doctor I will find me a new doctor, went out the door.

## 2019-06-16 NOTE — Progress Notes (Signed)
Patient left without being seen.  We were unable to see him in the office related to reported joint aches and abdominal pain as part of COVID screening.  Please see [employer] documentation from the same day.

## 2019-06-21 ENCOUNTER — Encounter: Payer: Self-pay | Admitting: Family Medicine

## 2019-06-23 ENCOUNTER — Other Ambulatory Visit: Payer: Self-pay

## 2019-06-23 ENCOUNTER — Encounter: Payer: Self-pay | Admitting: Family Medicine

## 2019-06-23 ENCOUNTER — Ambulatory Visit (INDEPENDENT_AMBULATORY_CARE_PROVIDER_SITE_OTHER): Payer: No Typology Code available for payment source | Admitting: Family Medicine

## 2019-06-23 VITALS — BP 148/96 | HR 60 | Temp 98.0°F | Resp 18 | Ht 73.0 in | Wt 330.0 lb

## 2019-06-23 DIAGNOSIS — H6591 Unspecified nonsuppurative otitis media, right ear: Secondary | ICD-10-CM

## 2019-06-23 MED ORDER — AMOXICILLIN-POT CLAVULANATE 875-125 MG PO TABS: 1.0000 | ORAL_TABLET | Freq: Two times a day (BID) | ORAL | 0 refills | Status: DC

## 2019-06-23 NOTE — Patient Instructions (Addendum)
Use claritin or allegra daily to help prevent allergy/ear symptoms  Use nasal sprays when having more congestion as needed  Eat yogurt or take probiotic with augmentin  Use tylenol or aleve as needed for pain   Otitis Media, Adult  Otitis media means that the middle ear is red and swollen (inflamed) and full of fluid. The condition usually goes away on its own. Follow these instructions at home:  Take over-the-counter and prescription medicines only as told by your doctor.  If you were prescribed an antibiotic medicine, take it as told by your doctor. Do not stop taking the antibiotic even if you start to feel better.  Keep all follow-up visits as told by your doctor. This is important. Contact a doctor if:  You have bleeding from your nose.  There is a lump on your neck.  You are not getting better in 5 days.  You feel worse instead of better. Get help right away if:  You have pain that is not helped with medicine.  You have swelling, redness, or pain around your ear.  You get a stiff neck.  You cannot move part of your face (paralyzed).  You notice that the bone behind your ear hurts when you touch it.  You get a very bad headache. Summary  Otitis media means that the middle ear is red, swollen, and full of fluid.  This condition usually goes away on its own. In some cases, treatment may be needed.  If you were prescribed an antibiotic medicine, take it as told by your doctor. This information is not intended to replace advice given to you by your health care provider. Make sure you discuss any questions you have with your health care provider. Document Released: 05/21/2008 Document Revised: 11/15/2017 Document Reviewed: 12/24/2016 Elsevier Patient Education  2020 [employer].

## 2019-06-23 NOTE — Progress Notes (Signed)
Subjective:    Patient ID: Devon Miranda, male    DOB: [birth year], 48 y.o.   MRN: [id number]  HPI   Patient presents to clinic complaining of right ear pain for the past couple of days.  Patient states he does struggle with seasonal allergies, but does not regularly take an allergy medicine and only uses a nasal spray on occasion if needed.  Patient does spend a lot of time outdoors due to having a pool.  States over the holiday weekend they were outdoors almost the entire weekend swimming, sitting on the deck or walking around in the grass.  Denies fever or chills.  Does have some nasal congestion and fullness feeling in ears as well as pain.  Denies any cough, shortness of breath or wheezing.  States at times when he opens mouth up wide he will feel a popping in her right ear/pain in right ear.  Patient Active Problem List   Diagnosis Date Noted  . Musculoskeletal chest pain 06/01/2019  . Sinusitis 06/01/2019  . Allergic rhinitis 06/01/2019  . Acid reflux 12/24/2018  . Chronic low back pain 11/28/2018  . Diverticulitis 11/19/2018  . Chronic pain of right knee 08/06/2018  . Encounter for general adult medical examination with abnormal findings 06/06/2018  . Scrotal pain 10/29/2017  . Pain in right testicle 10/23/2017  . OSA (obstructive sleep apnea) 10/23/2017  . Lightheadedness 10/07/2017  . Decreased hearing of left ear 10/07/2017  . Hypertriglyceridemia 10/07/2017  . History of hypoglycemia 10/07/2017  . PVC (premature ventricular contraction) 11/06/2013  . Anxiety 11/06/2013  . Bradycardia 09/21/2013  . Dyspnea 04/24/2012  . Near syncope 04/24/2012  . Hypertension 04/24/2012   Social History   Tobacco Use  . Smoking status: Never Smoker  . Smokeless tobacco: Never Used  Substance Use Topics  . Alcohol use: Yes    Comment: occassionally - 1x/mo   Review of Systems  Constitutional: Negative for chills, fatigue and fever.  HENT: Negative for congestion, sinus pain  and sore throat. +right ear pain   Eyes: Negative.   Respiratory: Negative for cough, shortness of breath and wheezing.   Cardiovascular: Negative for chest pain, palpitations and leg swelling.  Gastrointestinal: Negative for abdominal pain, diarrhea, nausea and vomiting.  Genitourinary: Negative for dysuria, frequency and urgency.  Musculoskeletal: Negative for arthralgias and myalgias.  Skin: Negative for color change, pallor and rash.  Neurological: Negative for syncope, light-headedness and headaches.  Psychiatric/Behavioral: The patient is not nervous/anxious.       Objective:   Physical Exam Vitals signs and nursing note reviewed.  Constitutional:      General: He is not in acute distress.    Appearance: He is not ill-appearing, toxic-appearing or diaphoretic.  HENT:     Head: Normocephalic and atraumatic.     Right Ear: Hearing, ear canal and external ear normal. Tenderness present. A middle ear effusion is present. Tympanic membrane is erythematous and bulging.     Left Ear: Hearing, ear canal and external ear normal. A middle ear effusion is present.     Nose: Congestion present.     Mouth/Throat:     Mouth: Mucous membranes are moist.     Pharynx: Oropharynx is clear.  Eyes:     General: No scleral icterus.    Extraocular Movements: Extraocular movements intact.     Pupils: Pupils are equal, round, and reactive to light.  Neck:     Musculoskeletal: Normal range of motion and neck supple.  Cardiovascular:  Rate and Rhythm: Normal rate and regular rhythm.     Heart sounds: Normal heart sounds.  Pulmonary:     Effort: Pulmonary effort is normal. No respiratory distress.     Breath sounds: Normal breath sounds.  Lymphadenopathy:     Cervical: No cervical adenopathy.  Skin:    General: Skin is warm and dry.     Coloration: Skin is not jaundiced or pale.  Neurological:     Mental Status: He is alert and oriented to person, place, and time.  Psychiatric:         Mood and Affect: Mood normal.        Behavior: Behavior normal.    Vitals:   06/23/19 0810  BP: (!) 148/96  Pulse: 60  Resp: 18  Temp: 98 F (36.7 C)  SpO2: 98%      Assessment & Plan:    Right otitis media-patient's right eardrum does appear red and bulging with effusion consistent with a ear infection.  We will treat with Augmentin twice daily for 10 days.  Advised to eat yogurt daily or take probiotic with this antibiotic to help offset any antibiotic associated gastritis that can occur.  Patient also advised to take some sort of allergy medication like a Claritin or Allegra daily if he is someone who spends a lot of time outdoors to help prevent congestion in ears and sinuses and future development of ear infections and sinus infections.  Also advised he can use Tylenol or Aleve as needed for pain relief in the ear.  Patient will keep regularly scheduled follow-up with PCP as planned and return to clinic sooner if any issues arise.

## 2019-06-24 ENCOUNTER — Encounter: Payer: Self-pay | Admitting: Family Medicine

## 2019-06-24 NOTE — Telephone Encounter (Signed)
Dr. Caryl Bis and Juliann Pulse. This was an oversight on my part. I will get his completed> I will see if Lorriane Shire can start the paperwork in my absence. I have included her on the note.   Vanessa-If you can please assist me with starting the dismissal paperwork for this patient. This was an oversight on my end and I apologize.

## 2019-06-24 NOTE — Telephone Encounter (Signed)
The dismissal has been placed on the patient's chart and the letter generated. I will need the letter signed by Dr. Caryl Bis before forwarding to HIM.

## 2019-06-30 ENCOUNTER — Telehealth: Payer: Self-pay | Admitting: Family Medicine

## 2019-06-30 NOTE — Telephone Encounter (Signed)
Patient dismissed from [facility] by Tommi Rumps MD, effective 06/24/19. Dismissal Letter sent out by 1st class mail. [employer]

## 2019-07-06 ENCOUNTER — Encounter: Payer: Self-pay | Admitting: Internal Medicine

## 2019-07-12 NOTE — Progress Notes (Addendum)
[facility] Consultation    Virtual Visit via Video Note I connected with patient on 07/13/19 at 11:00 AM EDT by video and verified that I am speaking with the correct person using two identifiers.   I discussed the limitations, risks of performing an evaluation and management service by video and the availability of in person appointments. I also discussed with the patient that there may be a patient responsible charge related to this service.  In light of current covid-19 pandemic, patient also understands that we are trying to protect them by minimizing in office contact if at all possible.  The patient expressed understanding and agreed to proceed. Please see note below for further detail.    The patient was advised to call back or seek an in-person evaluation if the symptoms worsen or if the condition fails to improve as anticipated.   Laverle Hobby, MD   Assessment and Plan:  Obstructive sleep apnea - Severe obstructive sleep apnea with AHI of 41. -Review of download shows minimal compliance, discussed that he needs to try to wear it every night to get his brain used to wearing CPAP. - Having trouble tolerating CPAP, pressure appears to be low at first, then pressure appears to be high when he wakes up at night.  We will adjust auto CPAP pressure to new range of 8-10 cm H2O.  Dyspnea - Symptoms appear atypical, occurring more with stress, and with sitting.  Does not appear to be brought on by activity. - Continue albuterol as needed. - Discussed being mindful of thoughts and triggers that could be making his breathing worse. - Discussed downloading a mindfulness/meditation app to use when he has the episodes of dyspnea.  Obesity.  --has abdominal obesity which may be contributing to dyspbea.  --recommend weight loss.   Orders Placed This Encounter  Procedures  . DG Chest 2 View  . Pulmonary Function Test [facility] Only     Return in about 3 months  (around 10/13/2019).   Date: 07/12/2019  MRN# [id number] Devon Miranda 1971/01/04    Devon Miranda is a 48 y.o. old male seen in consultation for chief complaint of: OSA and dyspnea.      HPI:   The patient is a 48 year old male referred for symptoms of excessive daytime sleepiness. Sleep study showed severe OSA with AHI of 41. He has been using cpap but is having issues with tolerating it. He wakes up and pulls it off his face, he says "I feel like I'm underwater" and "I feel like Im suffocating". He feels more awake when he is wearing it.  He also notes that occasionally he has issues with feeling that he has trouble getting in a full breath. This usually happens at rest, he is able to exert himself and not have a problem. It usually happens at this time of year.  He is using his inhaler during these times which helps a bit. 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   He has been started on CPAP, and feels that he is doing well with it, he I using it every night, and feels that he is more awake during the day. When he initially puts on the PAP he has trouble catching his breath.  He denies jaw pain, no TMJ.  He has episodes about every 2 weeks he has an episode of trouble breathing. No triggers, can occur at rest. He has been exercising every night and is not  limited by dyspnea. At last visit we tried him on albuterol which he uses and feels that it helps. The problem goes away on its own after 10 minutes.   He also notes that he has occasional breathing. He can exercise without difficulty, but sometimes when he is sitting he feels that he can not get in a full breath. It typically occurs at rest or with stress and more when sitting than standing.   **CPAP download 04/08/2019-07/06/2019>> raw data personally reviewed.  Usage greater than 4 hours 1/90 days.  Average usage on days used is 2 hours 4 minutes.  Pressure ranges 5-15.  Median pressure is 8.6, 95th  percentile pressure is 10.6, maximum pressure is 11.2.  Residual AHI 0.7.  Overall this shows noncompliance with CPAP with excellent control obstructive sleep apnea when used. **CPAP download 08/05/2018-09/11/2018>> raw data personally reviewed, average usage on days used is 7 hours 5 minutes.  Usage greater than 4 hours is 28/30 days.  Pressure ranges 5-15.  Median pressure 10, 95th percentile pressure 12, maximum pressure 14.  Residual AHI is 1.6.  Overall this shows very good compliance with CPAP with excellent control of obstructive sleep apnea. **HST 06/10/2018>> severe obstructive sleep apnea with AHI of 41.  Recommended auto CPAP with pressure range 5-20.  Medication:    Current Outpatient Medications:  .  Albuterol Sulfate (PROAIR RESPICLICK) 097 (90 Base) MCG/ACT AEPB, Inhale 1-2 puffs into the lungs every 6 (six) hours as needed., Disp: 1 each, Rfl: 5 .  amoxicillin-clavulanate (AUGMENTIN) 875-125 MG tablet, Take 1 tablet by mouth 2 (two) times daily., Disp: 20 tablet, Rfl: 0 .  azelastine (ASTELIN) 0.1 % nasal spray, Place 1 spray into both nostrils 2 (two) times daily. Use in each nostril as directed, Disp: 30 mL, Rfl: 0 .  Coenzyme Q10 (CO Q 10) 100 MG CAPS, Take by mouth daily., Disp: , Rfl:  .  fenofibrate 160 MG tablet, Take 1 tablet (160 mg total) by mouth daily., Disp: 90 tablet, Rfl: 1 .  ketotifen (ZADITOR) 0.025 % ophthalmic solution, Place 1 drop into both eyes 2 (two) times daily., Disp: 10 mL, Rfl: 0 .  lisinopril-hydrochlorothiazide (PRINZIDE,ZESTORETIC) 20-12.5 MG tablet, Take 1 tablet by mouth daily., Disp: 90 tablet, Rfl: 1 .  montelukast (SINGULAIR) 10 MG tablet, Take 1 tablet (10 mg total) by mouth at bedtime., Disp: 30 tablet, Rfl: 0 .  Multiple Vitamin (MULTIVITAMIN) capsule, Take 1 capsule daily by mouth., Disp: , Rfl:  .  Omega-3 Fatty Acids (FISH OIL) 1200 MG CPDR, Take by mouth. Takes 4 tablets daily., Disp: , Rfl:    Allergies:  Latex   Review of Systems:   Constitutional: Feels well. Cardiovascular: Denies chest pain, exertional chest pain.  Pulmonary: Denies hemoptysis, pleuritic chest pain.   The remainder of systems were reviewed and were found to be negative other than what is documented in the HPI.        LABORATORY PANEL:   CBC No results for input(s): WBC, HGB, HCT, PLT in the last 168 hours. ------------------------------------------------------------------------------------------------------------------  Chemistries  No results for input(s): NA, K, CL, CO2, GLUCOSE, BUN, CREATININE, CALCIUM, MG, AST, ALT, ALKPHOS, BILITOT in the last 168 hours.  Invalid input(s): GFRCGP ------------------------------------------------------------------------------------------------------------------  Cardiac Enzymes No results for input(s): TROPONINI in the last 168 hours. ------------------------------------------------------------  RADIOLOGY:  No results found.     Thank  you for the consultation and for allowing [facility] to assist in the care of your patient. Our  recommendations are noted above.  Please contact us if we can be of further service.  Marda Stalker, M.D., F.C.C.P.  Board Certified in Internal Medicine, Pulmonary Medicine, [place], and Sleep Medicine.  [facility] Office Number: [phone]  07/12/2019

## 2019-07-13 ENCOUNTER — Encounter: Payer: Self-pay | Admitting: Internal Medicine

## 2019-07-13 ENCOUNTER — Ambulatory Visit (INDEPENDENT_AMBULATORY_CARE_PROVIDER_SITE_OTHER): Payer: No Typology Code available for payment source | Admitting: Internal Medicine

## 2019-07-13 DIAGNOSIS — G4733 Obstructive sleep apnea (adult) (pediatric): Secondary | ICD-10-CM

## 2019-07-13 DIAGNOSIS — R0609 Other forms of dyspnea: Secondary | ICD-10-CM | POA: Diagnosis not present

## 2019-07-13 NOTE — Patient Instructions (Addendum)
Will adjust auto-CPAP pressure to range of 8-10 cm H2O.  Focus on things which may trigger your breathing episodes.  Recommend downloading a mindfullness or medication app that you can use when you get episodes of dyspnea.  We will check a lung function test.  Will check a chest x ray.

## 2019-07-15 ENCOUNTER — Other Ambulatory Visit: Payer: Self-pay | Admitting: Family Medicine

## 2019-08-05 ENCOUNTER — Encounter: Payer: Self-pay | Admitting: Physician Assistant

## 2019-08-05 ENCOUNTER — Other Ambulatory Visit: Payer: Self-pay

## 2019-08-05 ENCOUNTER — Ambulatory Visit (INDEPENDENT_AMBULATORY_CARE_PROVIDER_SITE_OTHER): Payer: No Typology Code available for payment source | Admitting: Physician Assistant

## 2019-08-05 VITALS — BP 127/80 | HR 62 | Temp 97.3°F | Resp 16 | Ht 73.0 in | Wt 330.0 lb

## 2019-08-05 DIAGNOSIS — E785 Hyperlipidemia, unspecified: Secondary | ICD-10-CM

## 2019-08-05 DIAGNOSIS — I10 Essential (primary) hypertension: Secondary | ICD-10-CM | POA: Diagnosis not present

## 2019-08-05 DIAGNOSIS — C3490 Malignant neoplasm of unspecified part of unspecified bronchus or lung: Secondary | ICD-10-CM | POA: Insufficient documentation

## 2019-08-05 NOTE — Patient Instructions (Signed)

## 2019-08-05 NOTE — Progress Notes (Signed)
Patient: Devon Miranda Male    DOB: Oct 15, 1971   47 y.o.   MRN: [id number] Visit Date: 08/05/2019  Today's Provider: Trinna Post, PA-C   Chief Complaint  Patient presents with  . Establish Care   Subjective:     HPI   Patient is here to establish care. Previously seen at [facility] and due to insurance changes has switched providers. Was seen at [facility] for a short time after that.   Wife is a Marine scientist at [employer]. 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 He has one 73 year old daughter. He also three older daughter children who live in [place].   HTN: Currently takes lisinopril-HCTZ 20-12.5 mg daily.   BP Readings from Last 3 Encounters:  08/05/19 127/80  06/23/19 (!) 148/96  02/24/19 130/90   Morbid Obesity: patient reports he doesn't eat much. Exercises through job at [employer].   Wt Readings from Last 3 Encounters:  08/05/19 (!) 330 lb (149.7 kg)  06/23/19 (!) 330 lb (149.7 kg)  02/24/19 (!) 324 lb (147 kg)    Sleep Apnea: Severe obstructive sleep apnea with AHI of 41. Monitored by Dr. Juanell Fairly and is noncompliant with CPAP based on 07/13/2019 note. Pressure was readjusted and patient feels this is slightly better though still has issues. Has tried to switch from full face mask to nasal mask.   Uncle passed away from kidney failure. Father passed away at a young age from an infection.   Allergies  Allergen Reactions  . Latex Itching and Rash    "cheap halloween masks on my face". No problem with gloves, etc.     Current Outpatient Medications:  .  Albuterol Sulfate (PROAIR RESPICLICK) 397 (90 Base) MCG/ACT AEPB, Inhale 1-2 puffs into the lungs every 6 (six) hours as needed., Disp: 1 each, Rfl: 5 .  Coenzyme Q10 (CO Q 10) 100 MG CAPS, Take by mouth daily., Disp: , Rfl:  .  fenofibrate 160 MG tablet, Take 1 tablet (160 mg total) by mouth daily., Disp: 90 tablet, Rfl: 1 .  lisinopril-hydrochlorothiazide  (PRINZIDE,ZESTORETIC) 20-12.5 MG tablet, Take 1 tablet by mouth daily., Disp: 90 tablet, Rfl: 1 .  Multiple Vitamin (MULTIVITAMIN) capsule, Take 1 capsule daily by mouth., Disp: , Rfl:  .  Omega-3 Fatty Acids (FISH OIL) 1200 MG CPDR, Take by mouth. Takes 4 tablets daily., Disp: , Rfl:  .  amoxicillin-clavulanate (AUGMENTIN) 875-125 MG tablet, Take 1 tablet by mouth 2 (two) times daily. (Patient not taking: Reported on 08/05/2019), Disp: 20 tablet, Rfl: 0 .  azelastine (ASTELIN) 0.1 % nasal spray, Place 1 spray into both nostrils 2 (two) times daily. Use in each nostril as directed (Patient not taking: Reported on 08/05/2019), Disp: 30 mL, Rfl: 0 .  ketotifen (ZADITOR) 0.025 % ophthalmic solution, Place 1 drop into both eyes 2 (two) times daily. (Patient not taking: Reported on 08/05/2019), Disp: 10 mL, Rfl: 0 .  montelukast (SINGULAIR) 10 MG tablet, Take 1 tablet (10 mg total) by mouth at bedtime. (Patient not taking: Reported on 08/05/2019), Disp: 30 tablet, Rfl: 0  Review of Systems  Respiratory: Positive for apnea.   Musculoskeletal: Positive for arthralgias.  Allergic/Immunologic: Positive for environmental allergies.  All other systems reviewed and are negative.   Social History   Tobacco Use  . Smoking status: Never Smoker  . Smokeless tobacco: Never Used  Substance Use Topics  . Alcohol use: Yes  Comment: occassionally - 1x/mo      Objective:   BP 127/80 (BP Location: Right Arm, Patient Position: Sitting, Cuff Size: Large)   Pulse 62   Temp (!) 97.3 F (36.3 C) (Temporal)   Resp 16   Ht 6\' 1"  (1.854 m)   Wt (!) 330 lb (149.7 kg)   SpO2 96%   BMI 43.54 kg/m  Vitals:   08/05/19 1512  BP: 127/80  Pulse: 62  Resp: 16  Temp: (!) 97.3 F (36.3 C)  TempSrc: Temporal  SpO2: 96%  Weight: (!) 330 lb (149.7 kg)  Height: 6\' 1"  (1.854 m)     Physical Exam Constitutional:      Appearance: Normal appearance.  Cardiovascular:     Rate and Rhythm: Normal rate and regular  rhythm.     Heart sounds: Normal heart sounds.  Pulmonary:     Effort: Pulmonary effort is normal.     Breath sounds: Normal breath sounds.  Skin:    General: Skin is warm and dry.  Neurological:     Mental Status: He is alert and oriented to person, place, and time. Mental status is at baseline.  Psychiatric:        Mood and Affect: Mood normal.        Behavior: Behavior normal.      No results found for any visits on 08/05/19.     Assessment & Plan    1. Essential hypertension  Continue current medication, labs as below.  - Lipid Profile  2. Morbid obesity ([place])  Previously doing keto diet and had lost some weight which he has since gained back.  - Comprehensive Metabolic Panel (CMET) - CBC with Differential - TSH  3. Hyperlipidemia, unspecified hyperlipidemia type  - Lipid Profile  The entirety of the information documented in the History of Present Illness, Review of Systems and Physical Exam were personally obtained by me. Portions of this information were initially documented by April M. Sabra Heck, CMA and reviewed by me for thoroughness and accuracy.      Trinna Post, PA-C  [facility]

## 2019-08-06 LAB — CBC WITH DIFFERENTIAL/PLATELET
Basophils Absolute: 0 10*3/uL (ref 0.0–0.2)
Basos: 1 %
EOS (ABSOLUTE): 0.2 10*3/uL (ref 0.0–0.4)
Eos: 2 %
Hematocrit: 40.6 % (ref 37.5–51.0)
Hemoglobin: 14.2 g/dL (ref 13.0–17.7)
Immature Grans (Abs): 0 10*3/uL (ref 0.0–0.1)
Immature Granulocytes: 0 %
Lymphocytes Absolute: 1.5 10*3/uL (ref 0.7–3.1)
Lymphs: 24 %
MCH: 30.5 pg (ref 26.6–33.0)
MCHC: 35 g/dL (ref 31.5–35.7)
MCV: 87 fL (ref 79–97)
Monocytes Absolute: 0.5 10*3/uL (ref 0.1–0.9)
Monocytes: 9 %
Neutrophils Absolute: 4.1 10*3/uL (ref 1.4–7.0)
Neutrophils: 64 %
Platelets: 213 10*3/uL (ref 150–450)
RBC: 4.65 x10E6/uL (ref 4.14–5.80)
RDW: 12.9 % (ref 11.6–15.4)
WBC: 6.3 10*3/uL (ref 3.4–10.8)

## 2019-08-06 LAB — COMPREHENSIVE METABOLIC PANEL
ALT: 14 IU/L (ref 0–44)
AST: 11 IU/L (ref 0–40)
Albumin/Globulin Ratio: 1.6 (ref 1.2–2.2)
Albumin: 4.4 g/dL (ref 4.0–5.0)
Alkaline Phosphatase: 62 IU/L (ref 39–117)
BUN/Creatinine Ratio: 17 (ref 9–20)
BUN: 19 mg/dL (ref 6–24)
Bilirubin Total: 0.3 mg/dL (ref 0.0–1.2)
CO2: 24 mmol/L (ref 20–29)
Calcium: 9.3 mg/dL (ref 8.7–10.2)
Chloride: 101 mmol/L (ref 96–106)
Creatinine, Ser: 1.11 mg/dL (ref 0.76–1.27)
GFR calc Af Amer: 90 mL/min/{1.73_m2} (ref >59)
GFR calc non Af Amer: 78 mL/min/{1.73_m2} (ref >59)
Globulin, Total: 2.7 g/dL (ref 1.5–4.5)
Glucose: 98 mg/dL (ref 65–99)
Potassium: 3.8 mmol/L (ref 3.5–5.2)
Sodium: 141 mmol/L (ref 134–144)
Total Protein: 7.1 g/dL (ref 6.0–8.5)

## 2019-08-06 LAB — LIPID PANEL
Chol/HDL Ratio: 5.1 ratio — ABNORMAL HIGH (ref 0.0–5.0)
Cholesterol, Total: 198 mg/dL (ref 100–199)
HDL: 39 mg/dL — ABNORMAL LOW (ref >39)
LDL Calculated: 105 mg/dL — ABNORMAL HIGH (ref 0–99)
Triglycerides: 271 mg/dL — ABNORMAL HIGH (ref 0–149)
VLDL Cholesterol Cal: 54 mg/dL — ABNORMAL HIGH (ref 5–40)

## 2019-08-06 LAB — TSH: TSH: 2.27 u[IU]/mL (ref 0.450–4.500)

## 2019-08-10 ENCOUNTER — Other Ambulatory Visit: Payer: Self-pay | Admitting: Physician Assistant

## 2019-08-10 NOTE — Telephone Encounter (Signed)
[facility] faxed refill request for the following medications:  fenofibrate 160 MG tablet  lisinopril-hydrochlorothiazide (PRINZIDE,ZESTORETIC) 20-12.5 MG tablet   Please advise.

## 2019-08-11 MED ORDER — LISINOPRIL-HYDROCHLOROTHIAZIDE 20-12.5 MG PO TABS: 1.0000 | ORAL_TABLET | Freq: Every day | ORAL | 1 refills | Status: DC

## 2019-08-11 MED ORDER — FENOFIBRATE 160 MG PO TABS: 160.0000 mg | ORAL_TABLET | Freq: Every day | ORAL | 1 refills | Status: DC

## 2019-09-22 ENCOUNTER — Other Ambulatory Visit: Payer: Self-pay

## 2019-09-22 ENCOUNTER — Ambulatory Visit (INDEPENDENT_AMBULATORY_CARE_PROVIDER_SITE_OTHER): Payer: No Typology Code available for payment source

## 2019-09-22 DIAGNOSIS — Z23 Encounter for immunization: Secondary | ICD-10-CM | POA: Diagnosis not present

## 2019-09-24 ENCOUNTER — Telehealth: Payer: Self-pay | Admitting: Internal Medicine

## 2019-09-24 NOTE — Telephone Encounter (Signed)
Lm to relay date/time of covid test. 09/28/2019 prior to 11:[facility]

## 2019-09-25 NOTE — Telephone Encounter (Signed)
Left message x2 for pt. 

## 2019-09-28 ENCOUNTER — Other Ambulatory Visit
Admission: RE | Admit: 2019-09-28 | Discharge: 2019-09-28 | Disposition: A | Discharge status: Home / Self Care | Payer: No Typology Code available for payment source | Source: Ambulatory Visit | Attending: Internal Medicine | Admitting: Internal Medicine

## 2019-09-28 DIAGNOSIS — Z20828 Contact with and (suspected) exposure to other viral communicable diseases: Secondary | ICD-10-CM | POA: Diagnosis not present

## 2019-09-28 DIAGNOSIS — Z01812 Encounter for preprocedural laboratory examination: Secondary | ICD-10-CM | POA: Insufficient documentation

## 2019-09-28 LAB — SARS CORONAVIRUS 2 (TAT 6-24 HRS): SARS Coronavirus 2: NEGATIVE

## 2019-09-28 NOTE — Telephone Encounter (Signed)
Pt is aware of date/time of covid test.   

## 2019-09-29 ENCOUNTER — Ambulatory Visit: Payer: No Typology Code available for payment source | Attending: Internal Medicine

## 2019-09-29 ENCOUNTER — Other Ambulatory Visit: Payer: Self-pay

## 2019-09-29 DIAGNOSIS — R0609 Other forms of dyspnea: Secondary | ICD-10-CM | POA: Diagnosis present

## 2019-09-29 DIAGNOSIS — R0600 Dyspnea, unspecified: Secondary | ICD-10-CM

## 2019-09-29 MED ORDER — ALBUTEROL SULFATE (2.5 MG/3ML) 0.083% IN NEBU
2.5000 mg | INHALATION_SOLUTION | Freq: Once | RESPIRATORY_TRACT | Status: AC
  Administered 2019-09-29: 2.5 mg via RESPIRATORY_TRACT
  Filled 2019-09-29: qty 3

## 2019-10-01 ENCOUNTER — Encounter: Payer: Self-pay | Admitting: Physician Assistant

## 2019-10-01 ENCOUNTER — Other Ambulatory Visit: Payer: Self-pay

## 2019-10-01 ENCOUNTER — Ambulatory Visit (INDEPENDENT_AMBULATORY_CARE_PROVIDER_SITE_OTHER): Payer: No Typology Code available for payment source | Admitting: Physician Assistant

## 2019-10-01 VITALS — BP 137/83 | HR 59 | Temp 97.3°F | Resp 16 | Wt 331.5 lb

## 2019-10-01 DIAGNOSIS — M542 Cervicalgia: Secondary | ICD-10-CM | POA: Diagnosis not present

## 2019-10-01 MED ORDER — CYCLOBENZAPRINE HCL 5 MG PO TABS: 5.0000 mg | ORAL_TABLET | Freq: Three times a day (TID) | ORAL | 0 refills | Status: DC | PRN

## 2019-10-01 NOTE — Progress Notes (Signed)
Patient: Devon Miranda Male    DOB: 1971-10-13   48 y.o.   MRN: [id number] Visit Date: 10/01/2019  Today's Provider: Trinna Post, PA-C   Chief Complaint  Patient presents with  . Neck Pain   Subjective:     HPI Neck pain: patient here today c/o neck pain x's 3 weeks. Patient reports pain is worsening and is causing his body to feel numb. Patient denies any injuries, or fever. Reports he can't feel his head or move it and has to lift his head with his hand when he wakes up in the middle of the night. Patient reports his wife massaged area with a menthol cream which helped some with pain. Patient reports taking Aleve two a day for 3 weeks. Reports difficulty sleeping when pain occurs at night. Tried switching pillows.    Allergies  Allergen Reactions  . Latex Itching and Rash    "cheap halloween masks on my face". No problem with gloves, etc.     Current Outpatient Medications:  .  Albuterol Sulfate (PROAIR RESPICLICK) 616 (90 Base) MCG/ACT AEPB, Inhale 1-2 puffs into the lungs every 6 (six) hours as needed., Disp: 1 each, Rfl: 5 .  Coenzyme Q10 (CO Q 10) 100 MG CAPS, Take by mouth daily., Disp: , Rfl:  .  fenofibrate 160 MG tablet, Take 1 tablet (160 mg total) by mouth daily., Disp: 90 tablet, Rfl: 1 .  lisinopril-hydrochlorothiazide (ZESTORETIC) 20-12.5 MG tablet, Take 1 tablet by mouth daily., Disp: 90 tablet, Rfl: 1 .  montelukast (SINGULAIR) 10 MG tablet, , Disp: , Rfl:  .  Multiple Vitamin (MULTIVITAMIN) capsule, Take 1 capsule daily by mouth., Disp: , Rfl:  .  Omega-3 Fatty Acids (FISH OIL) 1200 MG CPDR, Take by mouth. Takes 4 tablets daily., Disp: , Rfl:   Review of Systems  Constitutional: Negative.   Cardiovascular: Negative.   Musculoskeletal: Positive for neck pain and neck stiffness.  Neurological: Positive for numbness.    Social History   Tobacco Use  . Smoking status: Never Smoker  . Smokeless tobacco: Never Used  Substance Use Topics  .  Alcohol use: Yes    Comment: occassionally - 1x/mo      Objective:   BP 137/83 (BP Location: Left Arm, Patient Position: Sitting, Cuff Size: Large)   Pulse (!) 59   Temp (!) 97.3 F (36.3 C) (Temporal)   Resp 16   Wt (!) 331 lb 8 oz (150.4 kg)   BMI 43.74 kg/m  Vitals:   10/01/19 1453 10/01/19 1454  BP: (!) 147/85 137/83  Pulse: 65 (!) 59  Resp: 16   Temp: (!) 97.3 F (36.3 C)   TempSrc: Temporal   Weight: (!) 331 lb 8 oz (150.4 kg)   Body mass index is 43.74 kg/m.   Physical Exam Constitutional:      Appearance: Normal appearance.  Neck:     Musculoskeletal: Normal range of motion. Muscular tenderness present. No edema, pain with movement or spinous process tenderness.   Cardiovascular:     Rate and Rhythm: Normal rate.  Pulmonary:     Effort: Pulmonary effort is normal.  Skin:    General: Skin is warm and dry.  Neurological:     Mental Status: He is alert.  Psychiatric:        Mood and Affect: Mood normal.        Behavior: Behavior normal.      No results found for any visits on 10/01/19.  Assessment & Plan    1. Neck pain  Will try conservative treatment like muscle relaxer. May proceed with xray, trial of steroids, and possible referral to physiatrist if not improving.   - cyclobenzaprine (FLEXERIL) 5 MG tablet; Take 1 tablet (5 mg total) by mouth 3 (three) times daily as needed for muscle spasms.  Dispense: 30 tablet; Refill: 0  The entirety of the information documented in the History of Present Illness, Review of Systems and Physical Exam were personally obtained by me. Portions of this information were initially documented by Lynford Humphrey, CMA and reviewed by me for thoroughness and accuracy.      Trinna Post, PA-C  [facility]

## 2019-10-01 NOTE — Patient Instructions (Signed)

## 2019-10-13 ENCOUNTER — Encounter: Payer: Self-pay | Admitting: Physician Assistant

## 2019-10-13 DIAGNOSIS — M542 Cervicalgia: Secondary | ICD-10-CM

## 2019-10-14 ENCOUNTER — Ambulatory Visit
Admission: RE | Admit: 2019-10-14 | Discharge: 2019-10-14 | Disposition: A | Discharge status: Home / Self Care | Payer: No Typology Code available for payment source | Source: Ambulatory Visit | Attending: Physician Assistant | Admitting: Physician Assistant

## 2019-10-14 ENCOUNTER — Other Ambulatory Visit: Payer: Self-pay

## 2019-10-14 ENCOUNTER — Ambulatory Visit
Admission: RE | Admit: 2019-10-14 | Discharge: 2019-10-14 | Disposition: A | Discharge status: Home / Self Care | Payer: No Typology Code available for payment source | Attending: Physician Assistant | Admitting: Physician Assistant

## 2019-10-14 DIAGNOSIS — M542 Cervicalgia: Secondary | ICD-10-CM

## 2019-10-15 ENCOUNTER — Other Ambulatory Visit: Payer: Self-pay | Admitting: Physician Assistant

## 2019-10-15 DIAGNOSIS — M5030 Other cervical disc degeneration, unspecified cervical region: Secondary | ICD-10-CM

## 2019-10-15 NOTE — Progress Notes (Signed)
Referral to ortho placed.

## 2019-10-29 ENCOUNTER — Encounter: Payer: Self-pay | Admitting: Physician Assistant

## 2019-10-30 ENCOUNTER — Telehealth (INDEPENDENT_AMBULATORY_CARE_PROVIDER_SITE_OTHER): Payer: No Typology Code available for payment source | Admitting: Physician Assistant

## 2019-10-30 DIAGNOSIS — Z20828 Contact with and (suspected) exposure to other viral communicable diseases: Secondary | ICD-10-CM | POA: Diagnosis not present

## 2019-10-30 DIAGNOSIS — M791 Myalgia, unspecified site: Secondary | ICD-10-CM | POA: Diagnosis not present

## 2019-10-30 DIAGNOSIS — R05 Cough: Secondary | ICD-10-CM | POA: Diagnosis not present

## 2019-10-30 DIAGNOSIS — Z20822 Contact with and (suspected) exposure to covid-19: Secondary | ICD-10-CM

## 2019-10-30 DIAGNOSIS — R059 Cough, unspecified: Secondary | ICD-10-CM

## 2019-10-30 NOTE — Progress Notes (Signed)
Patient: Devon Miranda Male    DOB: 11/24/1971   48 y.o.   MRN: [id number] Visit Date: 10/30/2019  Today's Provider: Trinna Post, PA-C   Chief Complaint  Patient presents with  . URI   Subjective:     Virtual Visit via Video Note  I connected with Devon Miranda on 10/30/19 at 11:00 AM EST by a video enabled telemedicine application and verified that I am speaking with the correct person using two identifiers.  Location: Patient: Home Provider: Office   I discussed the limitations of evaluation and management by telemedicine and the availability of in person appointments. The patient expressed understanding and agreed to proceed.  URI  This is a new problem. The current episode started in the past 7 days. The problem has been gradually improving. Associated symptoms include congestion, coughing, headaches, nausea, a sore throat and wheezing. Pertinent negatives include no sinus pain or vomiting. He has tried decongestant and acetaminophen for the symptoms. The treatment provided no relief.  Patient states that he has some body aches and loss of smell. Reports his father in law had symptoms of COVID starting Saturday. Daughter and wife now have similar symptoms. Wife is confirmed positive for COVID yesterday.   Allergies  Allergen Reactions  . Latex Itching and Rash    "cheap halloween masks on my face". No problem with gloves, etc.     Current Outpatient Medications:  .  Albuterol Sulfate (PROAIR RESPICLICK) 093 (90 Base) MCG/ACT AEPB, Inhale 1-2 puffs into the lungs every 6 (six) hours as needed., Disp: 1 each, Rfl: 5 .  Coenzyme Q10 (CO Q 10) 100 MG CAPS, Take by mouth daily., Disp: , Rfl:  .  cyclobenzaprine (FLEXERIL) 5 MG tablet, Take 1 tablet (5 mg total) by mouth 3 (three) times daily as needed for muscle spasms., Disp: 30 tablet, Rfl: 0 .  fenofibrate 160 MG tablet, Take 1 tablet (160 mg total) by mouth daily., Disp: 90 tablet, Rfl: 1 .   lisinopril-hydrochlorothiazide (ZESTORETIC) 20-12.5 MG tablet, Take 1 tablet by mouth daily., Disp: 90 tablet, Rfl: 1 .  montelukast (SINGULAIR) 10 MG tablet, , Disp: , Rfl:  .  Multiple Vitamin (MULTIVITAMIN) capsule, Take 1 capsule daily by mouth., Disp: , Rfl:  .  Omega-3 Fatty Acids (FISH OIL) 1200 MG CPDR, Take by mouth. Takes 4 tablets daily., Disp: , Rfl:   Review of Systems  Constitutional: Positive for chills, diaphoresis and fever.  HENT: Positive for congestion, sinus pressure and sore throat. Negative for postnasal drip and sinus pain.   Respiratory: Positive for cough, chest tightness (Mainly when coughing per pt), shortness of breath and wheezing.   Gastrointestinal: Positive for nausea. Negative for vomiting.  Endocrine: Negative for cold intolerance and heat intolerance.  Neurological: Positive for headaches.    Social History   Tobacco Use  . Smoking status: Never Smoker  . Smokeless tobacco: Never Used  Substance Use Topics  . Alcohol use: Yes    Comment: occassionally - 1x/mo      Objective:   There were no vitals taken for this visit. There were no vitals filed for this visit.There is no height or weight on file to calculate BMI.   Physical Exam Constitutional:      Appearance: He is not ill-appearing.  Pulmonary:     Effort: Pulmonary effort is normal. No respiratory distress.  Psychiatric:        Mood and Affect: Mood normal.  Behavior: Behavior normal.      No results found for any visits on 10/30/19.     Assessment & Plan    1. Close exposure to COVID-19 virus  Recommend testing or else assuming he is positive and quarantining for 10 days. Discussed symptomatic treatment and return precautions.   2. Cough   3. Myalgia  I discussed the assessment and treatment plan with the patient. The patient was provided an opportunity to ask questions and all were answered. The patient agreed with the plan and demonstrated an understanding of the  instructions.   The patient was advised to call back or seek an in-person evaluation if the symptoms worsen or if the condition fails to improve as anticipated.  I provided 15 minutes of non-face-to-face time during this encounter.  The entirety of the information documented in the History of Present Illness, Review of Systems and Physical Exam were personally obtained by me. Portions of this information were initially documented by [facility], CMA and reviewed by me for thoroughness and accuracy.         Trinna Post, PA-C  [facility]

## 2019-11-24 ENCOUNTER — Ambulatory Visit
Admission: RE | Admit: 2019-11-24 | Discharge: 2019-11-24 | Disposition: A | Discharge status: Home / Self Care | Payer: No Typology Code available for payment source | Source: Ambulatory Visit | Attending: Physical Medicine and Rehabilitation | Admitting: Physical Medicine and Rehabilitation

## 2019-11-24 ENCOUNTER — Other Ambulatory Visit: Payer: Self-pay | Admitting: Physical Medicine and Rehabilitation

## 2019-11-24 ENCOUNTER — Other Ambulatory Visit: Payer: Self-pay

## 2019-11-24 DIAGNOSIS — M5030 Other cervical disc degeneration, unspecified cervical region: Secondary | ICD-10-CM | POA: Insufficient documentation

## 2019-11-24 DIAGNOSIS — M5412 Radiculopathy, cervical region: Secondary | ICD-10-CM | POA: Insufficient documentation

## 2019-11-27 ENCOUNTER — Other Ambulatory Visit: Payer: Self-pay | Admitting: Family Medicine

## 2019-11-27 DIAGNOSIS — M5412 Radiculopathy, cervical region: Secondary | ICD-10-CM

## 2019-11-27 DIAGNOSIS — M5030 Other cervical disc degeneration, unspecified cervical region: Secondary | ICD-10-CM

## 2019-12-04 ENCOUNTER — Other Ambulatory Visit: Payer: Self-pay

## 2019-12-04 ENCOUNTER — Ambulatory Visit
Admission: RE | Admit: 2019-12-04 | Discharge: 2019-12-04 | Disposition: A | Discharge status: Home / Self Care | Payer: No Typology Code available for payment source | Source: Ambulatory Visit | Attending: Family Medicine | Admitting: Family Medicine

## 2019-12-04 DIAGNOSIS — M5412 Radiculopathy, cervical region: Secondary | ICD-10-CM

## 2019-12-04 DIAGNOSIS — M5030 Other cervical disc degeneration, unspecified cervical region: Secondary | ICD-10-CM

## 2019-12-04 MED ORDER — IOPAMIDOL (ISOVUE-M 300) INJECTION 61%
1.0000 mL | Freq: Once | INTRAMUSCULAR | Status: AC | PRN
  Administered 2019-12-04: 09:00:00 1 mL via EPIDURAL

## 2019-12-04 MED ORDER — TRIAMCINOLONE ACETONIDE 40 MG/ML IJ SUSP (RADIOLOGY)
60.0000 mg | Freq: Once | INTRAMUSCULAR | Status: AC
  Administered 2019-12-04: 09:00:00 60 mg via EPIDURAL

## 2019-12-04 NOTE — Discharge Instructions (Signed)

## 2019-12-16 ENCOUNTER — Telehealth (INDEPENDENT_AMBULATORY_CARE_PROVIDER_SITE_OTHER): Payer: No Typology Code available for payment source | Admitting: Adult Health

## 2019-12-16 ENCOUNTER — Encounter: Payer: Self-pay | Admitting: Adult Health

## 2019-12-16 DIAGNOSIS — R0600 Dyspnea, unspecified: Secondary | ICD-10-CM | POA: Diagnosis not present

## 2019-12-16 DIAGNOSIS — G4733 Obstructive sleep apnea (adult) (pediatric): Secondary | ICD-10-CM

## 2019-12-16 NOTE — Progress Notes (Signed)
Virtual Visit via Video Note  I connected with Devon Miranda on 12/16/19 at  9:00 AM EST by a video enabled telemedicine application and verified that I am speaking with the correct person using two identifiers.  Location: Patient: Home   Provider: Office    I discussed the limitations of evaluation and management by telemedicine and the availability of in person appointments. The patient expressed understanding and agreed to proceed.  History of Present Illness: 48 yo male followed for Severe OSA and dyspnea .   Today's televisit is for a 4 month follow up. He has underlying OSA on nocturnal CPAP . Says he tries to wear CPAP each night but at times the pressure does not feel strong enough and he feels he can not get his breath. Other times he falls asleep prior to putting on.  Patient education on sleep apnea and CPAP.  Patient had had some chronic dyspnea over the last few years that comes and goes.  Seems to be more with sitting better with standing and exercise.  Has no associated chest pain, palpitations.  Does have some relations to stress.  Says he exercises a lot.  Has no trouble with running.  He says he actually feels better with exercise.  No known family history of coronary artery disease. PFTs done September 29, 2019 showed normal lung function with no airflow obstruction or restriction.  FEV1 was 89%, ratio 86, FVC 80%.  DLCO 97%.  No bronchodilator response. Denies any cough or wheezing.  Observations/Objective: Sleep study AHI 41/hr   Chest x-ray February 2019 was clear and unremarkable Weight is 331 pounds Assessment and Plan: OSA needs improved compliance We will adjust CPAP pressure setting 8 to 15 cm H2O.  Dyspnea questionable etiology.-PFTs are normal with no airflow obstruction or restriction. Does not have any associated chest pain.  Symptoms seem to improve with exercise so unlikely this is underlying cardiac.  Previous chest x-ray was clear.  Is a never  smoker. On return visit can repeat chest x-ray   Plan  Patient Instructions  Continue on CPAP at bedtime Try to wear at least 4 to 6 hours each night Work on healthy weight Do not drive if sleepy We will adjust CPAP pressure 8 to 15 cm H2O. Follow-up in 6 months Dr Mortimer Fries  And As needed       Follow Up Instructions: Follow-up in 6 months and as needed   I discussed the assessment and treatment plan with the patient. The patient was provided an opportunity to ask questions and all were answered. The patient agreed with the plan and demonstrated an understanding of the instructions.   The patient was advised to call back or seek an in-person evaluation if the symptoms worsen or if the condition fails to improve as anticipated.  I provided 28 minutes of non-face-to-face time during this encounter.   Rexene Edison, NP

## 2019-12-16 NOTE — Patient Instructions (Signed)
Continue on CPAP at bedtime Try to wear at least 4 to 6 hours each night Work on healthy weight Do not drive if sleepy We will adjust CPAP pressure 8 to 15 cm H2O. Follow-up in 6 months Dr Mortimer Fries  And As needed

## 2020-02-22 ENCOUNTER — Other Ambulatory Visit: Payer: Self-pay | Admitting: Physician Assistant

## 2020-02-22 NOTE — Telephone Encounter (Signed)
Requested medication (s) are due for refill today: yes  Requested medication (s) are on the active medication list: yes Lisinopril-hctz 20-12.5 mg and fenofibrate 160 mg   Last refill:  08/11/2019 for both  Future visit scheduled: no  Notes to clinic:  cardiovascular: ACEI + Diuretic combos failed Cardiovascular: antilipid - fibric acid derivatives failed  Requested Prescriptions  Pending Prescriptions Disp Refills   lisinopril-hydrochlorothiazide (ZESTORETIC) 20-12.5 MG tablet [Pharmacy Med Name: LISINOPRIL-HCTZ 20-12.5 MG 20-12.5 Tablet] 90 tablet 1    Sig: TAKE 1 TABLET BY MOUTH DAILY.      Cardiovascular:  ACEI + Diuretic Combos Failed - 02/22/2020  3:57 PM      Failed - Na in normal range and within 180 days    Sodium  Date Value Ref Range Status  08/05/2019 141 134 - 144 mmol/L Final  03/31/2012 138 136 - 145 mmol/L Final          Failed - K in normal range and within 180 days    Potassium  Date Value Ref Range Status  08/05/2019 3.8 3.5 - 5.2 mmol/L Final  03/31/2012 3.2 (L) 3.5 - 5.1 mmol/L Final          Failed - Cr in normal range and within 180 days    Creatinine  Date Value Ref Range Status  03/31/2012 1.00 0.60 - 1.30 mg/dL Final   Creatinine, Ser  Date Value Ref Range Status  08/05/2019 1.11 0.76 - 1.27 mg/dL Final          Failed - Ca in normal range and within 180 days    Calcium  Date Value Ref Range Status  08/05/2019 9.3 8.7 - 10.2 mg/dL Final   Calcium, Total  Date Value Ref Range Status  03/31/2012 8.4 (L) 8.5 - 10.1 mg/dL Final          Failed - Last BP in normal range    BP Readings from Last 1 Encounters:  12/04/19 (!) 164/113          Passed - Patient is not pregnant      Passed - Valid encounter within last 6 months    Recent Outpatient Visits           3 months ago Close exposure to COVID-19 virus   [facility], [place], PA-C   4 months ago Neck pain   [place], [place],  [place]   6 months ago Essential hypertension   [employer], Adriana M, PA-C                fenofibrate 160 MG tablet [Pharmacy Med Name: FENOFIBRATE 160 MG TABLET 160 Tablet] 90 tablet 1    Sig: TAKE 1 TABLET BY MOUTH DAILY.      Cardiovascular:  Antilipid - Fibric Acid Derivatives Failed - 02/22/2020  3:57 PM      Failed - LDL in normal range and within 360 days    LDL Calculated  Date Value Ref Range Status  08/05/2019 105 (H) 0 - 99 mg/dL Final   Direct LDL  Date Value Ref Range Status  12/04/2018 108.0 mg/dL Final    Comment:    Optimal:  <100 mg/dLNear or Above Optimal:  100-129 mg/dLBorderline High:  130-159 mg/dLHigh:  160-189 mg/dLVery High:  >190 mg/dL          Failed - HDL in normal range and within 360 days    HDL  Date Value Ref Range Status  08/05/2019  39 (L) >39 mg/dL Final          Failed - Triglycerides in normal range and within 360 days    Triglycerides  Date Value Ref Range Status  08/05/2019 271 (H) 0 - 149 mg/dL Final          Failed - ALT in normal range and within 180 days    ALT  Date Value Ref Range Status  08/05/2019 14 0 - 44 IU/L Final   SGPT (ALT)  Date Value Ref Range Status  03/31/2012 29 U/L Final    Comment:    12-78 NOTE: NEW REFERENCE RANGE 11/09/2011           Failed - AST in normal range and within 180 days    AST  Date Value Ref Range Status  08/05/2019 11 0 - 40 IU/L Final   SGOT(AST)  Date Value Ref Range Status  03/31/2012 30 15 - 37 Unit/L Final          Failed - Cr in normal range and within 180 days    Creatinine  Date Value Ref Range Status  03/31/2012 1.00 0.60 - 1.30 mg/dL Final   Creatinine, Ser  Date Value Ref Range Status  08/05/2019 1.11 0.76 - 1.27 mg/dL Final          Failed - eGFR in normal range and within 180 days    EGFR (African American)  Date Value Ref Range Status  03/31/2012 >60  Final   GFR calc Af Amer  Date Value Ref Range Status  08/05/2019 90 >59  mL/min/1.73 Final   EGFR (Non-African Amer.)  Date Value Ref Range Status  03/31/2012 >60  Final    Comment:    eGFR values <11m/min/1.73 m2 may be an indication of chronic kidney disease (CKD). Calculated eGFR is useful in patients with stable renal function. The eGFR calculation will not be reliable in acutely ill patients when serum creatinine is changing rapidly. It is not useful in  patients on dialysis. The eGFR calculation may not be applicable to patients at the low and high extremes of body sizes, pregnant women, and vegetarians.    GFR calc non Af Amer  Date Value Ref Range Status  08/05/2019 78 >59 mL/min/1.73 Final   GFR  Date Value Ref Range Status  06/06/2018 98.46 >60.00 mL/min Final          Passed - Total Cholesterol in normal range and within 360 days    Cholesterol, Total  Date Value Ref Range Status  08/05/2019 198 100 - 199 mg/dL Final          Passed - Valid encounter within last 12 months    Recent Outpatient Visits           3 months ago Close exposure to COVID-19 virus   [place]   [place]  6 months ago Essential hypertension   [place]

## 2020-02-23 NOTE — Telephone Encounter (Signed)
L.O.V. was on 10/01/2019 and no upcoming appointment. Medication send into pharmacy.

## 2020-03-08 ENCOUNTER — Other Ambulatory Visit: Payer: Self-pay | Admitting: Family Medicine

## 2020-03-08 DIAGNOSIS — M5412 Radiculopathy, cervical region: Secondary | ICD-10-CM

## 2020-03-15 ENCOUNTER — Encounter: Payer: Self-pay | Admitting: Physician Assistant

## 2020-03-23 ENCOUNTER — Other Ambulatory Visit: Payer: Self-pay

## 2020-03-23 ENCOUNTER — Ambulatory Visit
Admission: RE | Admit: 2020-03-23 | Discharge: 2020-03-23 | Disposition: A | Discharge status: Home / Self Care | Payer: No Typology Code available for payment source | Source: Ambulatory Visit | Attending: Family Medicine | Admitting: Family Medicine

## 2020-03-23 DIAGNOSIS — M542 Cervicalgia: Secondary | ICD-10-CM | POA: Diagnosis not present

## 2020-03-23 DIAGNOSIS — M5412 Radiculopathy, cervical region: Secondary | ICD-10-CM

## 2020-03-23 MED ORDER — TRIAMCINOLONE ACETONIDE 40 MG/ML IJ SUSP (RADIOLOGY)
60.0000 mg | Freq: Once | INTRAMUSCULAR | Status: AC
  Administered 2020-03-23: 10:00:00 60 mg via EPIDURAL

## 2020-03-23 MED ORDER — IOPAMIDOL (ISOVUE-M 300) INJECTION 61%
1.0000 mL | Freq: Once | INTRAMUSCULAR | Status: AC
  Administered 2020-03-23: 1 mL via EPIDURAL

## 2020-03-23 NOTE — Discharge Instructions (Signed)

## 2020-03-28 ENCOUNTER — Other Ambulatory Visit: Payer: Self-pay | Admitting: Physician Assistant

## 2020-03-31 ENCOUNTER — Other Ambulatory Visit: Payer: Self-pay | Admitting: Physician Assistant

## 2020-03-31 MED ORDER — MONTELUKAST SODIUM 10 MG PO TABS: 10.0000 mg | ORAL_TABLET | Freq: Every day | ORAL | 1 refills | Status: DC

## 2020-03-31 NOTE — Telephone Encounter (Signed)
Medication Refill - Medication: montelukast (SINGULAIR) 10 MG tablet   Has the patient contacted their pharmacy? No. (Agent: If no, request that the patient contact the pharmacy for the refill.) (Agent: If yes, when and what did the pharmacy advise?)  Preferred Pharmacy (with phone number or street name): [facility], [address]  [address], [place] [address]  Phone:  [phone] Fax:  [phone]   Agent: Please be advised that RX refills may take up to 3 business days. We ask that you follow-up with your pharmacy.

## 2020-03-31 NOTE — Telephone Encounter (Signed)
Requested medication (s) are due for refill today: yes  Requested medication (s) are on the active medication list: yes  Last refill:  Last filled by historical provider  Future visit scheduled: no  Notes to clinic:  Medication previously ordered by a different provider    Requested Prescriptions  Pending Prescriptions Disp Refills   montelukast (SINGULAIR) 10 MG tablet         Pulmonology:  Leukotriene Inhibitors Passed - 03/31/2020  3:35 PM      Passed - Valid encounter within last 12 months    Recent Outpatient Visits           5 months ago Close exposure to COVID-19 virus   [facility], Wendee [place], PA-C   6 months ago Neck pain   [facility], [place], [place]   7 months ago Essential hypertension   [place], [place], [place]

## 2020-05-25 ENCOUNTER — Encounter: Payer: Self-pay | Admitting: Physician Assistant

## 2020-06-22 ENCOUNTER — Encounter: Payer: Self-pay | Admitting: Physician Assistant

## 2020-06-23 NOTE — Progress Notes (Signed)
Complete physical exam   Patient: Devon Miranda   DOB: [birth year]   49 y.o. Male  MRN: [id number] Visit Date: 06/24/2020  Today's healthcare provider: Trinna Post, PA-C   Chief Complaint  Patient presents with  . Annual Exam  . Hyperlipidemia  . Hypertension   Subjective    Devon Miranda is a 49 y.o. male who presents today for a complete physical exam.  He reports consuming a general diet. The patient does not participate in regular exercise at present. He generally feels fairly well. He reports sleeping fairly well. He does have additional problems to discuss today.   Hypertension, follow-up  BP Readings from Last 3 Encounters:  06/24/20 138/82  03/23/20 (!) 144/75  12/04/19 (!) 164/113   Wt Readings from Last 3 Encounters:  06/24/20 (!) 340 lb (154.2 kg)  10/01/19 (!) 331 lb 8 oz (150.4 kg)  08/05/19 (!) 330 lb (149.7 kg)     He was last seen for hypertension 1 years ago.  BP at that visit was 144/75. Management since that visit includes no changes.  He reports good compliance with treatment. He is not having side effects.  He is following a Regular diet. He is not exercising. He does not smoke.  Use of agents associated with hypertension: none.   Outside blood pressures are checked occasionally. Symptoms: No chest pain No chest pressure  No palpitations No syncope  No dyspnea No orthopnea  No paroxysmal nocturnal dyspnea No lower extremity edema   Pertinent labs: Lab Results  Component Value Date   CHOL 198 08/05/2019   HDL 39 (L) 08/05/2019   LDLCALC 105 (H) 08/05/2019   LDLDIRECT 108.0 12/04/2018   TRIG 271 (H) 08/05/2019   CHOLHDL 5.1 (H) 08/05/2019   Lab Results  Component Value Date   NA 141 08/05/2019   K 3.8 08/05/2019   CREATININE 1.11 08/05/2019   GFRNONAA 78 08/05/2019   GFRAA 90 08/05/2019   GLUCOSE 98 08/05/2019     The 10-year ASCVD risk score Mikey Bussing DC Jr., et al., 2013) is: 5.4%   Lipid/Cholesterol,  Follow-up  Last lipid panel Other pertinent labs  Lab Results  Component Value Date   CHOL 198 08/05/2019   HDL 39 (L) 08/05/2019   LDLCALC 105 (H) 08/05/2019   LDLDIRECT 108.0 12/04/2018   TRIG 271 (H) 08/05/2019   CHOLHDL 5.1 (H) 08/05/2019   Lab Results  Component Value Date   ALT 14 08/05/2019   AST 11 08/05/2019   PLT 213 08/05/2019   TSH 2.270 08/05/2019     He was last seen for this 1 years ago.  Management since that visit includes no changes.  He reports good compliance with treatment. He is not having side effects.   Foot Pain  Patient also reports that he has had pain in his left foot X 2-3 weeks. He reports that the pain is worse when getting out of bed. He also reports that it is painful when he bears weight on it. He has not tried anything OTC for this.   Past Medical History:  Diagnosis Date  . Allergy   . Arthritis    knees  . Essential hypertension, benign   . GERD (gastroesophageal reflux disease)   . Near syncope 04/24/2012  . Other and unspecified hyperlipidemia   . Other testicular hypofunction   . PVC (premature ventricular contraction) 11/06/2013  . Sleep apnea    CPAP  . Unspecified essential hypertension    Past Surgical  History:  Procedure Laterality Date  . arthroscopic   1991   left knee surgery  . [place] EXTRACTION     Social History   Socioeconomic History  . Marital status: Married    Spouse name: Not on file  . Number of children: Not on file  . Years of education: Not on file  . Highest education level: Not on file  Occupational History  . Not on file  Tobacco Use  . Smoking status: Never Smoker  . Smokeless tobacco: Never Used  Vaping Use  . Vaping Use: Never used  Substance and Sexual Activity  . Alcohol use: Yes    Comment: occassionally - 1x/mo  . Drug use: No  . Sexual activity: Not on file  Other Topics Concern  . Not on file  Social History  Narrative  . Not on file   Social Determinants of Health   Financial Resource Strain:   . Difficulty of Paying Living Expenses:   Food Insecurity:   . Worried About Charity fundraiser in the Last Year:   . Arboriculturist in the Last Year:   Transportation Needs:   . Film/video editor (Medical):   Marland Kitchen Lack of Transportation (Non-Medical):   Physical Activity:   . Days of Exercise per Week:   . Minutes of Exercise per Session:   Stress:   . Feeling of Stress :   Social Connections:   . Frequency of Communication with Friends and Family:   . Frequency of Social Gatherings with Friends and Family:   . Attends Religious Services:   . Active Member of Clubs or Organizations:   . Attends Archivist Meetings:   Marland Kitchen Marital Status:   Intimate Partner Violence:   . Fear of Current or Ex-Partner:   . Emotionally Abused:   Marland Kitchen Physically Abused:   . Sexually Abused:    Family Status  Relation Name Status  . Mother  Alive       hypertension  . Father  Deceased       stroke  . MGF  (Not Specified)  . Neg Hx  (Not Specified)   Family History  Problem Relation Age of Onset  . Hyperlipidemia Mother   . Hypertension Mother   . Arthritis Mother   . Stroke Father   . Hyperlipidemia Father   . Hypertension Father   . Alcohol abuse Father   . Arthritis Father   . Colon cancer Maternal Grandfather   . Prostate cancer Neg Hx   . Bladder Cancer Neg Hx   . Kidney cancer Neg Hx    No Known Allergies  Patient Care Team: Paulene Floor as PCP - General (Physician Assistant)   Medications: Outpatient Medications Prior to Visit  Medication Sig  . Albuterol Sulfate (PROAIR RESPICLICK) 400 (90 Base) MCG/ACT AEPB Inhale 1-2 puffs into the lungs every 6 (six) hours as needed.  . Coenzyme Q10 (CO Q 10) 100 MG CAPS Take by mouth daily.  . fenofibrate 160 MG tablet TAKE 1 TABLET BY MOUTH DAILY.  Marland Kitchen lisinopril-hydrochlorothiazide (ZESTORETIC) 20-12.5 MG tablet TAKE 1  TABLET BY MOUTH DAILY.  . montelukast (SINGULAIR) 10 MG tablet Take 1 tablet (10 mg total) by mouth at bedtime.  . Multiple Vitamin (MULTIVITAMIN) capsule Take 1 capsule daily by mouth.  . Omega-3 Fatty Acids (FISH OIL) 1200 MG CPDR Take by mouth. Takes 4 tablets daily.  Marland Kitchen  cyclobenzaprine (FLEXERIL) 5 MG tablet Take 1 tablet (5 mg total) by mouth 3 (three) times daily as needed for muscle spasms.   No facility-administered medications prior to visit.    Review of Systems  Constitutional: Negative.   HENT: Negative.   Eyes: Negative.   Respiratory: Negative.   Cardiovascular: Negative.   Gastrointestinal: Negative.   Endocrine: Negative.   Genitourinary: Negative.   Musculoskeletal: Negative.   Skin: Negative.   Allergic/Immunologic: Negative.   Neurological: Negative.   Hematological: Negative.   Psychiatric/Behavioral: Negative.        Objective    BP 138/82   Pulse 68   Temp (!) 97.3 F (36.3 C)   Ht 6\' 3"  (1.905 m)   Wt (!) 340 lb (154.2 kg)   BMI 42.50 kg/m    Physical Exam Constitutional:      Appearance: He is obese.  HENT:     Right Ear: Tympanic membrane normal.     Left Ear: Tympanic membrane normal.  Eyes:     Pupils: Pupils are equal, round, and reactive to light.  Cardiovascular:     Rate and Rhythm: Normal rate and regular rhythm.     Pulses: Normal pulses.     Heart sounds: Normal heart sounds.  Pulmonary:     Effort: Pulmonary effort is normal.     Breath sounds: Normal breath sounds.  Abdominal:     General: Bowel sounds are normal.  Skin:    General: Skin is warm and dry.  Neurological:     Mental Status: He is alert and oriented to person, place, and time. Mental status is at baseline.  Psychiatric:        Mood and Affect: Mood normal.        Behavior: Behavior normal.      Last depression screening scores PHQ 2/9 Scores 06/24/2020 08/05/2019 11/28/2018  PHQ - 2 Score 0 0 0  PHQ- 9 Score - 0 -   Last fall risk screening Fall Risk   06/24/2020  Falls in the past year? 0  Number falls in past yr: 0  Injury with Fall? 0  Risk for fall due to : No Fall Risks  Follow up Falls evaluation completed   Last Audit-C alcohol use screening Alcohol Use Disorder Test (AUDIT) 06/24/2020  1. How often do you have a drink containing alcohol? 2  2. How many drinks containing alcohol do you have on a typical day when you are drinking? 1  3. How often do you have six or more drinks on one occasion? 1  AUDIT-C Score 4  4. How often during the last year have you found that you were not able to stop drinking once you had started? 0  5. How often during the last year have you failed to do what was normally expected from you because of drinking? 0  6. How often during the last year have you needed a first drink in the morning to get yourself going after a heavy drinking session? 0  7. How often during the last year have you had a feeling of guilt of remorse after drinking? 0  8. How often during the last year have you been unable to remember what happened the night before because you had been drinking? 0  9. Have you or someone else been injured as a result of your drinking? 0  10. Has a relative or friend or a doctor or another health worker been concerned about your drinking or suggested  you cut down? 0  Alcohol Use Disorder Identification Test Final Score (AUDIT) 4  Alcohol Brief Interventions/Follow-up AUDIT Score <7 follow-up not indicated   A score of 3 or more in women, and 4 or more in men indicates increased risk for alcohol abuse, EXCEPT if all of the points are from question 1   No results found for any visits on 06/24/20.  Assessment & Plan    1. Annual physical exam  - CBC with Differential/Platelet - TSH  2. Colon cancer screening  - Ambulatory referral to Gastroenterology  3. Need for hepatitis C screening test  - Hepatitis C antibody  4. Essential hypertension  - Comprehensive metabolic panel  5. Hyperlipidemia,  unspecified hyperlipidemia type * - Lipid panel  6. Plantar fasciitis  - Ambulatory referral to Podiatry  7. Need for Tdap vaccination - Tdap vaccine greater than or equal to 7yo IM   Routine Health Maintenance and Physical Exam  Exercise Activities and Dietary recommendations Goals   None     Immunization History  Administered Date(s) Administered  . Influenza,inj,Quad PF,6+ Mos 10/07/2017, 01/06/2019, 09/22/2019    Health Maintenance  Topic Date Due  . Hepatitis C Screening  Never done  . COVID-19 Vaccine (1) Never done  . TETANUS/TDAP  Never done  . INFLUENZA VACCINE  07/17/2020  . HIV Screening  Completed    Discussed health benefits of physical activity, and encouraged him to engage in regular exercise appropriate for his age and condition.   Return in about 1 year (around 06/24/2021).     ITrinna Post, PA-C, have reviewed all documentation for this visit. The documentation on 06/28/20 for the exam, diagnosis, procedures, and orders are all accurate and complete.    Paulene Floor  [facility] [phone] (phone) [phone] (fax)  [place]

## 2020-06-24 ENCOUNTER — Ambulatory Visit: Payer: No Typology Code available for payment source | Admitting: Physician Assistant

## 2020-06-24 ENCOUNTER — Ambulatory Visit (INDEPENDENT_AMBULATORY_CARE_PROVIDER_SITE_OTHER): Payer: 59 | Admitting: Physician Assistant

## 2020-06-24 ENCOUNTER — Other Ambulatory Visit: Payer: Self-pay

## 2020-06-24 ENCOUNTER — Encounter: Payer: Self-pay | Admitting: Physician Assistant

## 2020-06-24 VITALS — BP 138/82 | HR 68 | Temp 97.3°F | Ht 75.0 in | Wt 340.0 lb

## 2020-06-24 DIAGNOSIS — I10 Essential (primary) hypertension: Secondary | ICD-10-CM | POA: Diagnosis not present

## 2020-06-24 DIAGNOSIS — M722 Plantar fascial fibromatosis: Secondary | ICD-10-CM

## 2020-06-24 DIAGNOSIS — Z23 Encounter for immunization: Secondary | ICD-10-CM

## 2020-06-24 DIAGNOSIS — Z0000 Encounter for general adult medical examination without abnormal findings: Secondary | ICD-10-CM | POA: Diagnosis not present

## 2020-06-24 DIAGNOSIS — E785 Hyperlipidemia, unspecified: Secondary | ICD-10-CM | POA: Diagnosis not present

## 2020-06-24 DIAGNOSIS — Z1211 Encounter for screening for malignant neoplasm of colon: Secondary | ICD-10-CM | POA: Diagnosis not present

## 2020-06-24 DIAGNOSIS — Z1159 Encounter for screening for other viral diseases: Secondary | ICD-10-CM | POA: Diagnosis not present

## 2020-06-25 LAB — COMPREHENSIVE METABOLIC PANEL
ALT: 15 IU/L (ref 0–44)
AST: 15 IU/L (ref 0–40)
Albumin/Globulin Ratio: 1.6 (ref 1.2–2.2)
Albumin: 4.4 g/dL (ref 4.0–5.0)
Alkaline Phosphatase: 72 IU/L (ref 48–121)
BUN/Creatinine Ratio: 14 (ref 9–20)
BUN: 12 mg/dL (ref 6–24)
Bilirubin Total: 0.4 mg/dL (ref 0.0–1.2)
CO2: 25 mmol/L (ref 20–29)
Calcium: 10 mg/dL (ref 8.7–10.2)
Chloride: 101 mmol/L (ref 96–106)
Creatinine, Ser: 0.88 mg/dL (ref 0.76–1.27)
GFR calc Af Amer: 117 mL/min/{1.73_m2} (ref >59)
GFR calc non Af Amer: 101 mL/min/{1.73_m2} (ref >59)
Globulin, Total: 2.8 g/dL (ref 1.5–4.5)
Glucose: 86 mg/dL (ref 65–99)
Potassium: 4 mmol/L (ref 3.5–5.2)
Sodium: 142 mmol/L (ref 134–144)
Total Protein: 7.2 g/dL (ref 6.0–8.5)

## 2020-06-25 LAB — CBC WITH DIFFERENTIAL/PLATELET
Basophils Absolute: 0.1 10*3/uL (ref 0.0–0.2)
Basos: 1 %
EOS (ABSOLUTE): 0.1 10*3/uL (ref 0.0–0.4)
Eos: 1 %
Hematocrit: 42.2 % (ref 37.5–51.0)
Hemoglobin: 14.3 g/dL (ref 13.0–17.7)
Immature Grans (Abs): 0 10*3/uL (ref 0.0–0.1)
Immature Granulocytes: 0 %
Lymphocytes Absolute: 1.5 10*3/uL (ref 0.7–3.1)
Lymphs: 21 %
MCH: 30.6 pg (ref 26.6–33.0)
MCHC: 33.9 g/dL (ref 31.5–35.7)
MCV: 90 fL (ref 79–97)
Monocytes Absolute: 0.6 10*3/uL (ref 0.1–0.9)
Monocytes: 9 %
Neutrophils Absolute: 4.8 10*3/uL (ref 1.4–7.0)
Neutrophils: 68 %
Platelets: 254 10*3/uL (ref 150–450)
RBC: 4.67 x10E6/uL (ref 4.14–5.80)
RDW: 13.1 % (ref 11.6–15.4)
WBC: 7.1 10*3/uL (ref 3.4–10.8)

## 2020-06-25 LAB — LIPID PANEL
Chol/HDL Ratio: 4.3 ratio (ref 0.0–5.0)
Cholesterol, Total: 193 mg/dL (ref 100–199)
HDL: 45 mg/dL (ref >39)
LDL Chol Calc (NIH): 120 mg/dL — ABNORMAL HIGH (ref 0–99)
Triglycerides: 158 mg/dL — ABNORMAL HIGH (ref 0–149)
VLDL Cholesterol Cal: 28 mg/dL (ref 5–40)

## 2020-06-25 LAB — TSH: TSH: 2.04 u[IU]/mL (ref 0.450–4.500)

## 2020-06-25 LAB — HEPATITIS C ANTIBODY: Hep C Virus Ab: <0.1 s/co ratio (ref 0.0–0.9)

## 2020-07-05 ENCOUNTER — Other Ambulatory Visit: Payer: Self-pay

## 2020-07-05 ENCOUNTER — Ambulatory Visit: Payer: 59 | Admitting: Podiatry

## 2020-07-05 ENCOUNTER — Encounter: Payer: Self-pay | Admitting: Podiatry

## 2020-07-05 ENCOUNTER — Ambulatory Visit (INDEPENDENT_AMBULATORY_CARE_PROVIDER_SITE_OTHER): Payer: 59

## 2020-07-05 ENCOUNTER — Other Ambulatory Visit: Payer: Self-pay | Admitting: Podiatry

## 2020-07-05 DIAGNOSIS — M722 Plantar fascial fibromatosis: Secondary | ICD-10-CM

## 2020-07-05 DIAGNOSIS — M7672 Peroneal tendinitis, left leg: Secondary | ICD-10-CM

## 2020-07-05 MED ORDER — MELOXICAM 15 MG PO TABS: 15.0000 mg | ORAL_TABLET | Freq: Every day | ORAL | 1 refills | Status: DC

## 2020-07-05 MED ORDER — METHYLPREDNISOLONE 4 MG PO TBPK: ORAL_TABLET | ORAL | 0 refills | Status: DC

## 2020-07-05 NOTE — Progress Notes (Signed)
° °  HPI: 49 y.o. male presenting today as a new patient for evaluation of left foot pain.  Patient states approximately 1 month ago he sustained an injury and twisted his foot.  Patient states that he heard an audible pop.  He was able to weight-bear however over the past 4 weeks he has noticed some increased swelling and constant tenderness to the area.  He denies having any bruising to the area.  He has not done anything for treatment he presents for further treatment evaluation  Past Medical History:  Diagnosis Date   Allergy    Arthritis    knees   Essential hypertension, benign    GERD (gastroesophageal reflux disease)    Near syncope 04/24/2012   Other and unspecified hyperlipidemia    Other testicular hypofunction    PVC (premature ventricular contraction) 11/06/2013   Sleep apnea    CPAP   Unspecified essential hypertension      Physical Exam: General: The patient is alert and oriented x3 in no acute distress.  Dermatology: Skin is warm, dry and supple bilateral lower extremities. Negative for open lesions or macerations.  Vascular: Palpable pedal pulses bilaterally. No edema or erythema noted. Capillary refill within normal limits.  Neurological: Epicritic and protective threshold grossly intact bilaterally.   Musculoskeletal Exam: Range of motion within normal limits to all pedal and ankle joints bilateral. Muscle strength 5/5 in all groups bilateral.  There is some pain on palpation along the insertion of the peroneal tendons at the fifth metatarsal tubercle, as well as the lateral column plantar  Radiographic Exam:  Normal osseous mineralization. Joint spaces preserved. No fracture/dislocation/boney destruction.    Assessment: 1.  Insertional peroneal tendinitis left, more plantar   Plan of Care:  1. Patient evaluated. X-Rays reviewed.  2.  Injection of 0.5 cc Celestone Soluspan injected into the peroneal tendon sheath at the insertion of the fifth metatarsal  tubercle left 3.  Cam boot dispensed.  Weightbearing as tolerated 4.  Compression anklet dispensed.  Wear daily 5.  Prescription for Medrol Dosepak 6.  Prescription for meloxicam to begin taking after completion of the Dosepak 7.  Return to clinic in 4 weeks      Edrick Kins, DPM [facility]  Dr. Edrick Kins, DPM    [address], [place] [address]                Office [phone]  Fax [phone]

## 2020-07-12 ENCOUNTER — Telehealth (INDEPENDENT_AMBULATORY_CARE_PROVIDER_SITE_OTHER): Payer: Self-pay | Admitting: Gastroenterology

## 2020-07-12 DIAGNOSIS — Z1211 Encounter for screening for malignant neoplasm of colon: Secondary | ICD-10-CM

## 2020-07-12 NOTE — Progress Notes (Signed)
Gastroenterology Pre-Procedure Review  Request Date: (Not Scheduled-Due in January 2022 when he turns 67 because insurance will not cover at this time) Patient has been asked to call the office back in December to schedule for January. Requesting Physician: Dr. Allen Norris  PATIENT REVIEW QUESTIONS: The patient responded to the following health history questions as indicated:    1. Are you having any GI issues? no 2. Do you have a personal history of Polyps? no 3. Do you have a family history of Colon Cancer or Polyps? yes (grandfather had different forms of cancer, colon cancer one of them.) 4. Diabetes Mellitus? no 5. Joint replacements in the past 12 months?no 6. Major health problems in the past 3 months?no 7. Any artificial heart valves, MVP, or defibrillator?no    MEDICATIONS & ALLERGIES:    Patient reports the following regarding taking any anticoagulation/antiplatelet therapy:   Plavix, Coumadin, Eliquis, Xarelto, Lovenox, Pradaxa, Brilinta, or Effient? no Aspirin? no  Patient confirms/reports the following medications:  Current Outpatient Medications  Medication Sig Dispense Refill  . Albuterol Sulfate (PROAIR RESPICLICK) 347 (90 Base) MCG/ACT AEPB Inhale 1-2 puffs into the lungs every 6 (six) hours as needed. 1 each 5  . Coenzyme Q10 (CO Q 10) 100 MG CAPS Take by mouth daily.    . fenofibrate 160 MG tablet TAKE 1 TABLET BY MOUTH DAILY. 90 tablet 1  . Glucosamine 500 MG CAPS     . lisinopril-hydrochlorothiazide (ZESTORETIC) 20-12.5 MG tablet TAKE 1 TABLET BY MOUTH DAILY. 90 tablet 1  . methylPREDNISolone (MEDROL DOSEPAK) 4 MG TBPK tablet 6 day dose pack - take as directed 21 tablet 0  . Multiple Vitamin (MULTIVITAMIN) capsule Take 1 capsule daily by mouth.    . Omega-3 Fatty Acids (FISH OIL) 1200 MG CPDR Take by mouth. Takes 4 tablets daily.    . cyclobenzaprine (FLEXERIL) 5 MG tablet Take 1 tablet (5 mg total) by mouth 3 (three) times daily as needed for muscle spasms. (Patient not  taking: Reported on 07/12/2020) 30 tablet 0  . meloxicam (MOBIC) 15 MG tablet Take 1 tablet (15 mg total) by mouth daily. (Patient not taking: Reported on 07/12/2020) 30 tablet 1  . montelukast (SINGULAIR) 10 MG tablet Take 1 tablet (10 mg total) by mouth at bedtime. (Patient not taking: Reported on 07/12/2020) 90 tablet 1   No current facility-administered medications for this visit.    Patient confirms/reports the following allergies:  No Known Allergies  No orders of the defined types were placed in this encounter.   AUTHORIZATION INFORMATION Primary Insurance: 1D#: Group #:  Secondary Insurance: 1D#: Group #:  SCHEDULE INFORMATION: Date: To Be Determined Time: Location:

## 2020-08-09 ENCOUNTER — Ambulatory Visit: Payer: 59 | Admitting: Podiatry

## 2020-08-09 ENCOUNTER — Other Ambulatory Visit: Payer: Self-pay | Admitting: Podiatry

## 2020-08-09 ENCOUNTER — Other Ambulatory Visit: Payer: Self-pay

## 2020-08-09 DIAGNOSIS — M7672 Peroneal tendinitis, left leg: Secondary | ICD-10-CM

## 2020-08-09 MED ORDER — MELOXICAM 15 MG PO TABS: 15.0000 mg | ORAL_TABLET | Freq: Every day | ORAL | 1 refills | Status: DC

## 2020-08-09 NOTE — Progress Notes (Signed)
   HPI: 49 y.o. male presenting today for follow-up evaluation of peroneal tendinitis to the left foot.  Patient states that he is approximately 75% better.  He still uses the cam boot intermittently.  He says that the injection helped for approximately 1 week.  He just recently ran out of his meloxicam.  Otherwise no new complaints at this time  Past Medical History:  Diagnosis Date  . Allergy   . Arthritis    knees  . Essential hypertension, benign   . GERD (gastroesophageal reflux disease)   . Near syncope 04/24/2012  . Other and unspecified hyperlipidemia   . Other testicular hypofunction   . PVC (premature ventricular contraction) 11/06/2013  . Sleep apnea    CPAP  . Unspecified essential hypertension      Physical Exam: General: The patient is alert and oriented x3 in no acute distress.  Dermatology: Skin is warm, dry and supple bilateral lower extremities. Negative for open lesions or macerations.  Vascular: Palpable pedal pulses bilaterally. No edema or erythema noted. Capillary refill within normal limits.  Neurological: Epicritic and protective threshold grossly intact bilaterally.   Musculoskeletal Exam: Range of motion within normal limits to all pedal and ankle joints bilateral. Muscle strength 5/5 in all groups bilateral.  There is some pain on palpation along the insertion of the peroneal tendons at the fifth metatarsal tubercle, as well as the lateral column plantar  Assessment: 1.  Insertional peroneal tendinitis left, more plantar   Plan of Care:  1. Patient evaluated.  2.  Patient declined injection today 3.  Continue meloxicam daily as needed.  Refill prescription sent to pharmacy 4.  Discontinue cam boot. 5.  Ankle brace dispensed today.  Wear daily 6.  Return to clinic as needed  *Owns a laser tag place     Edrick Kins, DPM [facility]  Dr. Edrick Kins, DPM    [address], [place] [address]                Office [phone]  Fax [phone]

## 2020-09-13 ENCOUNTER — Telehealth: Payer: Self-pay

## 2020-09-13 DIAGNOSIS — S022XXS Fracture of nasal bones, sequela: Secondary | ICD-10-CM

## 2020-09-13 NOTE — Telephone Encounter (Signed)
Copied from [place] [id number]. Topic: Referral - Request for Referral >> Sep 13, 2020  1:52 PM Oneta Rack wrote: As per patient spouse, patient broke his nose when he was an adolescent and now having nostril breathing issues. Patient would like PCP to refer to ENT, please advise

## 2020-09-14 NOTE — Telephone Encounter (Signed)
Referral placed.

## 2020-09-19 NOTE — Telephone Encounter (Signed)
Pts wife is calling regarding referral for ENT . Please advise

## 2020-09-20 ENCOUNTER — Encounter: Payer: Self-pay | Admitting: Internal Medicine

## 2020-09-20 ENCOUNTER — Other Ambulatory Visit: Payer: Self-pay

## 2020-09-20 ENCOUNTER — Ambulatory Visit: Payer: 59 | Admitting: Internal Medicine

## 2020-09-20 VITALS — BP 150/84 | HR 59 | Ht 73.0 in | Wt 348.0 lb

## 2020-09-20 DIAGNOSIS — I10 Essential (primary) hypertension: Secondary | ICD-10-CM

## 2020-09-20 DIAGNOSIS — I493 Ventricular premature depolarization: Secondary | ICD-10-CM | POA: Diagnosis not present

## 2020-09-20 DIAGNOSIS — R001 Bradycardia, unspecified: Secondary | ICD-10-CM

## 2020-09-20 NOTE — Patient Instructions (Signed)
Medication Instructions:  - Your physician recommends that you continue on your current medications as directed. Please refer to the Current Medication list given to you today.  *If you need a refill on your cardiac medications before your next appointment, please call your pharmacy*   Lab Work: - none ordered  If you have labs (blood work) drawn today and your tests are completely normal, you will receive your results only by: Marland Kitchen MyChart Message (if you have MyChart) OR . A paper copy in the mail If you have any lab test that is abnormal or we need to change your treatment, we will call you to review the results.   Testing/Procedures: - none ordered   Follow-Up: At [facility], you and your health needs are our priority.  As part of our continuing mission to provide you with exceptional heart care, we have created designated Provider Care Teams.  These Care Teams include your primary Cardiologist (physician) and Advanced Practice Providers (APPs -  Physician Assistants and Nurse Practitioners) who all work together to provide you with the care you need, when you need it.  We recommend signing up for the patient portal called "MyChart".  Sign up information is provided on this After Visit Summary.  MyChart is used to connect with patients for Virtual Visits (Telemedicine).  Patients are able to view lab/test results, encounter notes, upcoming appointments, etc.  Non-urgent messages can be sent to your provider as well.   To learn more about what you can do with MyChart, go to [email or web address].    Your next appointment:   As needed   The format for your next appointment:   In Person  Provider:   Virl Axe, MD   Other Instructions n/a

## 2020-09-20 NOTE — Progress Notes (Signed)
ELECTROPHYSIOLOGY OFFICE NOTE  Patient ID: Devon Miranda, MRN: [id number], DOB/AGE: 1971/11/23 49 y.o. Admit date: (Not on file) Date of Consult: 09/20/2020  Primary Physician: Trinna Post, PA-C Primary Cardiologist: SK       HPI Devon Miranda is a 49 y.o. male seen after hiatus of more than 4 years for  symptomatic PVCs and presyncope, my impression of which had been that it was neurally mediated  PVCs have been relatively quiescient.  No interval presyncope.  Weight continues to be an issue.  No chest pain.  Nocturnal dyspnea orthopnea.  Some dyspnea on exertion.  No peripheral edema.    DATE TEST EF   1/15 CTA  CaScore 0            Past Medical History:  Diagnosis Date  . Allergy   . Arthritis    knees  . Essential hypertension, benign   . GERD (gastroesophageal reflux disease)   . Near syncope 04/24/2012  . Other and unspecified hyperlipidemia   . Other testicular hypofunction   . PVC (premature ventricular contraction) 11/06/2013  . Sleep apnea    CPAP  . Unspecified essential hypertension       Surgical History:  Past Surgical History:  Procedure Laterality Date  . arthroscopic   1991   left knee surgery  . [place]  . WISDOM TOOTH EXTRACTION       Home Meds: Current Meds  Medication Sig  . Albuterol Sulfate (PROAIR RESPICLICK) 664 (90 Base) MCG/ACT AEPB Inhale 1-2 puffs into the lungs every 6 (six) hours as needed.  . Coenzyme Q10 (CO Q 10) 100 MG CAPS Take by mouth daily.  . fenofibrate 160 MG tablet TAKE 1 TABLET BY MOUTH DAILY.  Marland Kitchen Glucosamine 500 MG CAPS   . lisinopril-hydrochlorothiazide (ZESTORETIC) 20-12.5 MG tablet TAKE 1 TABLET BY MOUTH DAILY.  . meloxicam (MOBIC) 15 MG tablet Take 1 tablet (15 mg total) by mouth daily.  . montelukast (SINGULAIR) 10 MG tablet Take 1 tablet (10 mg total) by mouth at bedtime. (Patient taking differently: Take 10 mg by mouth as needed. )  . Multiple  Vitamin (MULTIVITAMIN) capsule Take 1 capsule daily by mouth.  . Omega-3 Fatty Acids (FISH OIL) 1200 MG CPDR Take by mouth. Takes 4 tablets daily.    Allergies: No Known Allergies  Social History   Socioeconomic History  . Marital status: Married    Spouse name: Not on file  . Number of children: Not on file  . Years of education: Not on file  . Highest education level: Not on file  Occupational History  . Not on file  Tobacco Use  . Smoking status: Never Smoker  . Smokeless tobacco: Never Used  Vaping Use  . Vaping Use: Never used  Substance and Sexual Activity  . Alcohol use: Yes    Comment: occassionally - 1x/mo  . Drug use: No  . Sexual activity: Not on file  Other Topics Concern  . Not on file  Social History Narrative  . Not on file   Social Determinants of Health   Financial Resource Strain:   . Difficulty of Paying Living Expenses: Not on file  Food Insecurity:   . Worried About Charity fundraiser in the Last Year: Not on file  . Ran Out of Food in the Last Year: Not on file  Transportation Needs:   . Lack of Transportation (Medical): Not on file  .  Lack of Transportation (Non-Medical): Not on file  Physical Activity:   . Days of Exercise per Week: Not on file  . Minutes of Exercise per Session: Not on file  Stress:   . Feeling of Stress : Not on file  Social Connections:   . Frequency of Communication with Friends and Family: Not on file  . Frequency of Social Gatherings with Friends and Family: Not on file  . Attends Religious Services: Not on file  . Active Member of Clubs or Organizations: Not on file  . Attends Archivist Meetings: Not on file  . Marital Status: Not on file  Intimate Partner Violence:   . Fear of Current or Ex-Partner: Not on file  . Emotionally Abused: Not on file  . Physically Abused: Not on file  . Sexually Abused: Not on file     Family History  Problem Relation Age of Onset  . Hyperlipidemia Mother   .  Hypertension Mother   . Arthritis Mother   . Stroke Father   . Hyperlipidemia Father   . Hypertension Father   . Alcohol abuse Father   . Arthritis Father   . Colon cancer Maternal Grandfather   . Prostate cancer Neg Hx   . Bladder Cancer Neg Hx   . Kidney cancer Neg Hx      ROS:  Please see the history of present illness.    All other systems reviewed and negative.    Physical Exam:  Blood pressure (!) 150/84, pulse (!) 59, height 6\' 1"  (1.854 m), weight (!) 348 lb (157.9 kg). General: Well developed, Morbidly obese  male in no acute distress. Head: Normocephalic, atraumatic, sclera non-icteric, no xanthomas, nares are without discharge. EENT: normal  Lymph Nodes:  none Neck: Negative for carotid bruits. JVD not elevated. Back:without scoliosis kyphosis Lungs: Clear bilaterally to auscultation without wheezes, rales, or rhonchi. Breathing is unlabored. Heart: RRR with S1 S2. No  murmur . No rubs, or gallops appreciated. Abdomen: Soft, non-tender, non-distended with normoactive bowel sounds. No hepatomegaly. No rebound/guarding. No obvious abdominal masses. Msk:  Strength and tone appear normal for age. Extremities: No clubbing or cyanosis. No edema.  Distal pedal pulses are 2+ and equal bilaterally. Skin: Warm and Dry Neuro: Alert and oriented X 3. CN III-XII intact Grossly normal sensory and motor function . Psych:  Responds to questions appropriately with a normal affect.      Labs: Cardiac Enzymes No results for input(s): CKTOTAL, CKMB, TROPONINI in the last 72 hours. CBC Lab Results  Component Value Date   WBC 7.1 06/24/2020   HGB 14.3 06/24/2020   HCT 42.2 06/24/2020   MCV 90 06/24/2020   PLT 254 06/24/2020   PROTIME: No results for input(s): LABPROT, INR in the last 72 hours. Chemistry No results for input(s): NA, K, CL, CO2, BUN, CREATININE, CALCIUM, PROT, BILITOT, ALKPHOS, ALT, AST, GLUCOSE in the last 168 hours.  Invalid input(s): LABALBU Lipids Lab  Results  Component Value Date   CHOL 193 06/24/2020   HDL 45 06/24/2020   LDLCALC 120 (H) 06/24/2020   TRIG 158 (H) 06/24/2020   BNP No results found for: PROBNP Thyroid Function Tests: No results for input(s): TSH, T4TOTAL, T3FREE, THYROIDAB in the last 72 hours.  Invalid input(s): FREET3 Miscellaneous No results found for: DDIMER  Radiology/Studies:  No results found.  EKG: sinus @ 59 14/11/44   Assessment and Plan:  PVC  Obesity  Vaccine status -neg   PVCs are quiescient.  Long discussion  regarding obesity and the importance of exercise.  Weight has gone up during the Covid here largely related to sedentary behaviors he thinks.  Currently not vaccinated.  His wife is.  Discussed data regarding protection related to natural immunity and encouraged him to get his Covid antibodies assessed.       Virl Axe

## 2020-09-26 ENCOUNTER — Ambulatory Visit: Payer: 59

## 2020-09-29 DIAGNOSIS — J309 Allergic rhinitis, unspecified: Secondary | ICD-10-CM | POA: Diagnosis not present

## 2020-09-29 DIAGNOSIS — J3489 Other specified disorders of nose and nasal sinuses: Secondary | ICD-10-CM | POA: Diagnosis not present

## 2020-09-30 ENCOUNTER — Other Ambulatory Visit: Payer: Self-pay | Admitting: Unknown Physician Specialty

## 2020-09-30 DIAGNOSIS — J3489 Other specified disorders of nose and nasal sinuses: Secondary | ICD-10-CM

## 2020-10-05 ENCOUNTER — Other Ambulatory Visit: Payer: Self-pay | Admitting: Unknown Physician Specialty

## 2020-10-07 ENCOUNTER — Other Ambulatory Visit: Payer: Self-pay | Admitting: Physician Assistant

## 2020-10-11 DIAGNOSIS — J301 Allergic rhinitis due to pollen: Secondary | ICD-10-CM | POA: Diagnosis not present

## 2020-10-13 ENCOUNTER — Ambulatory Visit
Admission: RE | Admit: 2020-10-13 | Discharge: 2020-10-13 | Disposition: A | Discharge status: Home / Self Care | Payer: 59 | Source: Ambulatory Visit | Attending: Unknown Physician Specialty | Admitting: Unknown Physician Specialty

## 2020-10-13 ENCOUNTER — Other Ambulatory Visit: Payer: Self-pay

## 2020-10-13 DIAGNOSIS — J3489 Other specified disorders of nose and nasal sinuses: Secondary | ICD-10-CM | POA: Diagnosis not present

## 2020-10-13 DIAGNOSIS — J321 Chronic frontal sinusitis: Secondary | ICD-10-CM | POA: Diagnosis not present

## 2020-10-13 DIAGNOSIS — J320 Chronic maxillary sinusitis: Secondary | ICD-10-CM | POA: Diagnosis not present

## 2020-10-25 DIAGNOSIS — J342 Deviated nasal septum: Secondary | ICD-10-CM | POA: Diagnosis not present

## 2020-10-25 DIAGNOSIS — J309 Allergic rhinitis, unspecified: Secondary | ICD-10-CM | POA: Diagnosis not present

## 2020-11-21 ENCOUNTER — Inpatient Hospital Stay: Admission: RE | Admit: 2020-11-21 | Payer: 59 | Source: Ambulatory Visit

## 2020-11-24 ENCOUNTER — Encounter
Admission: RE | Admit: 2020-11-24 | Discharge: 2020-11-24 | Disposition: A | Discharge status: Home / Self Care | Payer: 59 | Source: Ambulatory Visit | Attending: Unknown Physician Specialty | Admitting: Unknown Physician Specialty

## 2020-11-24 ENCOUNTER — Other Ambulatory Visit: Payer: Self-pay

## 2020-11-24 NOTE — Patient Instructions (Signed)
Your procedure is scheduled on: 11/29/20 Report to [facility]. To find out your arrival time please call [phone] between 1PM - 3PM on 11/28/20.  Remember: Instructions that are not followed completely may result in serious medical risk, up to and including death, or upon the discretion of your surgeon and anesthesiologist your surgery may need to be rescheduled.     _X__ 1. Do not eat food after midnight the night before your procedure.                 No gum chewing or hard candies. You may drink clear liquids up to 2 hours                 before you are scheduled to arrive for your surgery- DO not drink clear                 liquids within 2 hours of the start of your surgery.                 Clear Liquids include:  water, apple juice without pulp, clear carbohydrate                 drink such as Clearfast or Gatorade, Black Coffee or Tea (Do not add                 anything to coffee or tea). Diabetics water only  __X__2.  On the morning of surgery brush your teeth with toothpaste and water, you                 may rinse your mouth with mouthwash if you wish.  Do not swallow any              toothpaste of mouthwash.     _X__ 3.  No Alcohol for 24 hours before or after surgery.   _X__ 4.  Do Not Smoke or use e-cigarettes For 24 Hours Prior to Your Surgery.                 Do not use any chewable tobacco products for at least 6 hours prior to                 surgery.  ____  5.  Bring all medications with you on the day of surgery if instructed.   __X__  6.  Notify your doctor if there is any change in your medical condition      (cold, fever, infections).     Do not wear jewelry, make-up, hairpins, clips or nail polish. Do not wear lotions, powders, or perfumes.  Do not shave 48 hours prior to surgery. Men may shave face and neck. Do not bring valuables to the hospital.    [facility] is not responsible for any belongings  or valuables.  Contacts, dentures/partials or body piercings may not be worn into surgery. Bring a case for your contacts, glasses or hearing aids, a denture cup will be supplied. Leave your suitcase in the car. After surgery it may be brought to your room. For patients admitted to the hospital, discharge time is determined by your treatment team.   Patients discharged the day of surgery will not be allowed to drive home.   Please read over the following fact sheets that you were given:   MRSA Information  __X__ Take these medicines the morning of surgery with A SIP OF WATER:  1. NONE  2.   3.   4.  5.  6.  ____ Fleet Enema (as directed)   ____ Use CHG Soap/SAGE wipes as directed  ____ Use inhalers on the day of surgery  ____ Stop metformin/Janumet/Farxiga 2 days prior to surgery    ____ Take 1/2 of usual insulin dose the night before surgery. No insulin the morning          of surgery.   ____ Stop Blood Thinners Coumadin/Plavix/Xarelto/Pleta/Pradaxa/Eliquis/Effient/Aspirin  on   Or contact your Surgeon, Cardiologist or Medical Doctor regarding  ability to stop your blood thinners  __X__ Stop Anti-inflammatories 7 days before surgery such as Advil, Ibuprofen, Motrin,  BC or Goodies Powder, Naprosyn, Naproxen, Aleve, Aspirin   STOP MELOXICAM AND ALEVE TODAY 11/24/20. MAY USE TYLENOL  __X__ Stop all herbal supplements, fish oil or vitamin E until after surgery. STOP ALL SUPPLEMENTS UNTIL AFTER YOUR PROCEDURE   ____ Bring C-Pap to the hospital.

## 2020-11-25 ENCOUNTER — Other Ambulatory Visit
Admission: RE | Admit: 2020-11-25 | Discharge: 2020-11-25 | Disposition: A | Discharge status: Home / Self Care | Payer: 59 | Source: Ambulatory Visit | Attending: Unknown Physician Specialty | Admitting: Unknown Physician Specialty

## 2020-11-25 DIAGNOSIS — Z01812 Encounter for preprocedural laboratory examination: Secondary | ICD-10-CM | POA: Insufficient documentation

## 2020-11-25 DIAGNOSIS — Z20822 Contact with and (suspected) exposure to covid-19: Secondary | ICD-10-CM | POA: Diagnosis not present

## 2020-11-25 LAB — BASIC METABOLIC PANEL
Anion gap: 10 (ref 5–15)
BUN: 17 mg/dL (ref 6–20)
CO2: 27 mmol/L (ref 22–32)
Calcium: 8.9 mg/dL (ref 8.9–10.3)
Chloride: 100 mmol/L (ref 98–111)
Creatinine, Ser: 0.75 mg/dL (ref 0.61–1.24)
GFR, Estimated: >60 mL/min (ref >60)
Glucose, Bld: 105 mg/dL — ABNORMAL HIGH (ref 70–99)
Potassium: 3.8 mmol/L (ref 3.5–5.1)
Sodium: 137 mmol/L (ref 135–145)

## 2020-11-26 LAB — SARS CORONAVIRUS 2 (TAT 6-24 HRS): SARS Coronavirus 2: NEGATIVE

## 2020-11-29 ENCOUNTER — Ambulatory Visit
Admission: RE | Admit: 2020-11-29 | Discharge: 2020-11-29 | Disposition: A | Discharge status: Home / Self Care | Payer: 59 | Attending: Unknown Physician Specialty | Admitting: Unknown Physician Specialty

## 2020-11-29 ENCOUNTER — Encounter
Admission: RE | Disposition: A | Discharge status: Home / Self Care | Payer: Self-pay | Source: Home / Self Care | Attending: Unknown Physician Specialty

## 2020-11-29 ENCOUNTER — Encounter: Payer: Self-pay | Admitting: Unknown Physician Specialty

## 2020-11-29 ENCOUNTER — Ambulatory Visit: Payer: 59 | Admitting: Certified Registered Nurse Anesthetist

## 2020-11-29 ENCOUNTER — Other Ambulatory Visit: Payer: Self-pay

## 2020-11-29 ENCOUNTER — Other Ambulatory Visit: Payer: Self-pay | Admitting: Unknown Physician Specialty

## 2020-11-29 DIAGNOSIS — I10 Essential (primary) hypertension: Secondary | ICD-10-CM | POA: Diagnosis not present

## 2020-11-29 DIAGNOSIS — G4733 Obstructive sleep apnea (adult) (pediatric): Secondary | ICD-10-CM | POA: Diagnosis not present

## 2020-11-29 DIAGNOSIS — J342 Deviated nasal septum: Secondary | ICD-10-CM | POA: Diagnosis not present

## 2020-11-29 DIAGNOSIS — J343 Hypertrophy of nasal turbinates: Secondary | ICD-10-CM | POA: Diagnosis not present

## 2020-11-29 DIAGNOSIS — J3489 Other specified disorders of nose and nasal sinuses: Secondary | ICD-10-CM | POA: Diagnosis present

## 2020-11-29 DIAGNOSIS — K219 Gastro-esophageal reflux disease without esophagitis: Secondary | ICD-10-CM | POA: Diagnosis not present

## 2020-11-29 HISTORY — PX: NASAL SEPTOPLASTY W/ TURBINOPLASTY: SHX2070

## 2020-11-29 SURGERY — SEPTOPLASTY, NOSE, WITH NASAL TURBINATE REDUCTION
Anesthesia: General | Laterality: Bilateral

## 2020-11-29 MED ORDER — ORAL CARE MOUTH RINSE: 15.0000 mL | Freq: Once | OROMUCOSAL | Status: AC

## 2020-11-29 MED ORDER — DEXMEDETOMIDINE (PRECEDEX) IN NS 20 MCG/5ML (4 MCG/ML) IV SYRINGE
PREFILLED_SYRINGE | INTRAVENOUS | Status: DC | PRN
  Administered 2020-11-29: 2 ug via INTRAVENOUS

## 2020-11-29 MED ORDER — OXYCODONE HCL 5 MG PO TABS
ORAL_TABLET | ORAL | Status: AC
  Filled 2020-11-29: qty 1

## 2020-11-29 MED ORDER — SEVOFLURANE IN SOLN
RESPIRATORY_TRACT | Status: AC
  Filled 2020-11-29: qty 250

## 2020-11-29 MED ORDER — FENTANYL CITRATE (PF) 100 MCG/2ML IJ SOLN
INTRAMUSCULAR | Status: AC
  Filled 2020-11-29: qty 2

## 2020-11-29 MED ORDER — ALBUTEROL SULFATE HFA 108 (90 BASE) MCG/ACT IN AERS
INHALATION_SPRAY | RESPIRATORY_TRACT | Status: DC | PRN
Start: 2020-11-29 — End: 2020-11-29
  Administered 2020-11-29: 6 via RESPIRATORY_TRACT

## 2020-11-29 MED ORDER — BACITRACIN-NEOMYCIN-POLYMYXIN OINTMENT TUBE
TOPICAL_OINTMENT | CUTANEOUS | Status: DC | PRN
  Administered 2020-11-29: 1 via TOPICAL

## 2020-11-29 MED ORDER — OXYCODONE-ACETAMINOPHEN 5-325 MG PO TABS: 1.0000 | ORAL_TABLET | ORAL | 0 refills | Status: DC | PRN

## 2020-11-29 MED ORDER — FAMOTIDINE 20 MG PO TABS
ORAL_TABLET | ORAL | Status: AC
  Administered 2020-11-29: 06:00:00 20 mg via ORAL
  Filled 2020-11-29: qty 1

## 2020-11-29 MED ORDER — ONDANSETRON HCL 4 MG/2ML IJ SOLN: 4.0000 mg | Freq: Once | INTRAMUSCULAR | Status: DC | PRN

## 2020-11-29 MED ORDER — PROPOFOL 10 MG/ML IV BOLUS
INTRAVENOUS | Status: AC
  Filled 2020-11-29: qty 20

## 2020-11-29 MED ORDER — BACITRACIN ZINC 500 UNIT/GM EX OINT
TOPICAL_OINTMENT | CUTANEOUS | Status: AC
  Filled 2020-11-29: qty 28.35

## 2020-11-29 MED ORDER — LIDOCAINE HCL (PF) 2 % IJ SOLN
INTRAMUSCULAR | Status: AC
  Filled 2020-11-29: qty 5

## 2020-11-29 MED ORDER — FENTANYL CITRATE (PF) 100 MCG/2ML IJ SOLN
25.0000 ug | INTRAMUSCULAR | Status: DC | PRN
  Administered 2020-11-29 (×2): 50 ug via INTRAVENOUS

## 2020-11-29 MED ORDER — LIDOCAINE-EPINEPHRINE 1 %-1:100000 IJ SOLN
INTRAMUSCULAR | Status: DC | PRN
  Administered 2020-11-29: 9 mL

## 2020-11-29 MED ORDER — EPHEDRINE SULFATE 50 MG/ML IJ SOLN
INTRAMUSCULAR | Status: DC | PRN
  Administered 2020-11-29 (×3): 5 mg via INTRAVENOUS

## 2020-11-29 MED ORDER — ROCURONIUM BROMIDE 100 MG/10ML IV SOLN
INTRAVENOUS | Status: DC | PRN
  Administered 2020-11-29: 40 mg via INTRAVENOUS
  Administered 2020-11-29: 20 mg via INTRAVENOUS
  Administered 2020-11-29: 10 mg via INTRAVENOUS

## 2020-11-29 MED ORDER — OXYCODONE HCL 5 MG/5ML PO SOLN: 5.0000 mg | Freq: Once | ORAL | Status: AC | PRN

## 2020-11-29 MED ORDER — FENTANYL CITRATE (PF) 100 MCG/2ML IJ SOLN
INTRAMUSCULAR | Status: DC | PRN
  Administered 2020-11-29: 100 ug via INTRAVENOUS

## 2020-11-29 MED ORDER — CHLORHEXIDINE GLUCONATE 0.12 % MT SOLN
OROMUCOSAL | Status: AC
  Administered 2020-11-29: 06:00:00 15 mL via OROMUCOSAL
  Filled 2020-11-29: qty 15

## 2020-11-29 MED ORDER — SULFAMETHOXAZOLE-TRIMETHOPRIM 800-160 MG PO TABS: 1.0000 | ORAL_TABLET | Freq: Two times a day (BID) | ORAL | 0 refills | Status: DC

## 2020-11-29 MED ORDER — PHENYLEPHRINE HCL (PRESSORS) 10 MG/ML IV SOLN
INTRAVENOUS | Status: DC | PRN
  Administered 2020-11-29 (×2): 100 ug via INTRAVENOUS
  Administered 2020-11-29 (×2): 150 ug via INTRAVENOUS
  Administered 2020-11-29: 100 ug via INTRAVENOUS

## 2020-11-29 MED ORDER — LACTATED RINGERS IV SOLN: INTRAVENOUS | Status: DC

## 2020-11-29 MED ORDER — OXYMETAZOLINE HCL 0.05 % NA SOLN
NASAL | Status: AC
  Administered 2020-11-29: 07:00:00 3 via NASAL
  Filled 2020-11-29: qty 30

## 2020-11-29 MED ORDER — MIDAZOLAM HCL 2 MG/2ML IJ SOLN
INTRAMUSCULAR | Status: DC | PRN
  Administered 2020-11-29: 2 mg via INTRAVENOUS

## 2020-11-29 MED ORDER — DEXAMETHASONE SODIUM PHOSPHATE 10 MG/ML IJ SOLN
INTRAMUSCULAR | Status: DC | PRN
  Administered 2020-11-29: 10 mg via INTRAVENOUS

## 2020-11-29 MED ORDER — LIDOCAINE HCL (CARDIAC) PF 100 MG/5ML IV SOSY
PREFILLED_SYRINGE | INTRAVENOUS | Status: DC | PRN
  Administered 2020-11-29: 100 mg via INTRAVENOUS

## 2020-11-29 MED ORDER — DEXAMETHASONE SODIUM PHOSPHATE 10 MG/ML IJ SOLN
INTRAMUSCULAR | Status: AC
  Filled 2020-11-29: qty 1

## 2020-11-29 MED ORDER — CHLORHEXIDINE GLUCONATE 0.12 % MT SOLN: 15.0000 mL | Freq: Once | OROMUCOSAL | Status: AC

## 2020-11-29 MED ORDER — ONDANSETRON HCL 4 MG/2ML IJ SOLN
INTRAMUSCULAR | Status: DC | PRN
  Administered 2020-11-29: 4 mg via INTRAVENOUS

## 2020-11-29 MED ORDER — OXYCODONE HCL 5 MG PO TABS
5.0000 mg | ORAL_TABLET | Freq: Once | ORAL | Status: AC | PRN
  Administered 2020-11-29: 5 mg via ORAL

## 2020-11-29 MED ORDER — MIDAZOLAM HCL 2 MG/2ML IJ SOLN
INTRAMUSCULAR | Status: AC
  Filled 2020-11-29: qty 2

## 2020-11-29 MED ORDER — FAMOTIDINE 20 MG PO TABS: 20.0000 mg | ORAL_TABLET | Freq: Once | ORAL | Status: AC

## 2020-11-29 MED ORDER — PROPOFOL 10 MG/ML IV BOLUS
INTRAVENOUS | Status: DC | PRN
  Administered 2020-11-29: 80 mg via INTRAVENOUS
  Administered 2020-11-29: 200 mg via INTRAVENOUS

## 2020-11-29 MED ORDER — OXYMETAZOLINE HCL 0.05 % NA SOLN
NASAL | Status: AC
  Filled 2020-11-29: qty 30

## 2020-11-29 MED ORDER — OXYMETAZOLINE HCL 0.05 % NA SOLN: 3.0000 | Freq: Once | NASAL | Status: AC

## 2020-11-29 MED ORDER — LIDOCAINE-EPINEPHRINE 1 %-1:100000 IJ SOLN
INTRAMUSCULAR | Status: AC
  Filled 2020-11-29: qty 1

## 2020-11-29 MED ORDER — LIDOCAINE HCL (PF) 4 % IJ SOLN
INTRAMUSCULAR | Status: AC
  Filled 2020-11-29: qty 10

## 2020-11-29 MED ORDER — ONDANSETRON HCL 4 MG/2ML IJ SOLN
INTRAMUSCULAR | Status: AC
  Filled 2020-11-29: qty 2

## 2020-11-29 MED ORDER — SUCCINYLCHOLINE CHLORIDE 20 MG/ML IJ SOLN
INTRAMUSCULAR | Status: DC | PRN
  Administered 2020-11-29: 140 mg via INTRAVENOUS

## 2020-11-29 MED ORDER — SUGAMMADEX SODIUM 500 MG/5ML IV SOLN
INTRAVENOUS | Status: DC | PRN
  Administered 2020-11-29: 300 mg via INTRAVENOUS

## 2020-11-29 SURGICAL SUPPLY — 26 items
BLADE SURG 15 STRL LF DISP TIS (BLADE) ×1 IMPLANT
BLADE SURG 15 STRL SS (BLADE) ×1
CANISTER SUCT 1200ML W/VALVE (MISCELLANEOUS) ×2 IMPLANT
COAG SUCT 10F 3.5MM HAND CTRL (MISCELLANEOUS) ×2 IMPLANT
COVER WAND RF STERILE (DRAPES) ×2 IMPLANT
DRESSING NASL FOAM PST OP SINU (MISCELLANEOUS) ×2 IMPLANT
DRSG NASAL FOAM POST OP SINU (MISCELLANEOUS) ×4
ELECT REM PT RETURN 9FT ADLT (ELECTROSURGICAL) ×2
ELECTRODE REM PT RTRN 9FT ADLT (ELECTROSURGICAL) ×1 IMPLANT
GLOVE BIO SURGEON STRL SZ7.5 (GLOVE) IMPLANT
GLOVE SURG SYN 7.5  E (GLOVE) ×2
GLOVE SURG SYN 7.5 E (GLOVE) ×2 IMPLANT
GOWN STRL REUS W/ TWL LRG LVL3 (GOWN DISPOSABLE) ×2 IMPLANT
GOWN STRL REUS W/TWL LRG LVL3 (GOWN DISPOSABLE) ×2
LABEL OR SOLS (LABEL) ×2 IMPLANT
MANIFOLD NEPTUNE II (INSTRUMENTS) ×2 IMPLANT
NS IRRIG 500ML POUR BTL (IV SOLUTION) ×2 IMPLANT
PACK HEAD/NECK (MISCELLANEOUS) ×2 IMPLANT
SPLINT NASAL REUTER .5MM (MISCELLANEOUS) ×2 IMPLANT
SPONGE NEURO XRAY DETECT 1X3 (DISPOSABLE) ×2 IMPLANT
SUT CHROMIC 3-0 (SUTURE) ×1
SUT CHROMIC 3-0 KS 27XMFL CR (SUTURE) ×1
SUT ETHILON 3-0 KS 30 BLK (SUTURE) ×2 IMPLANT
SUT PLAIN GUT 4-0 (SUTURE) ×2 IMPLANT
SUTURE CHRMC 3-0 KS 27XMFL CR (SUTURE) ×1 IMPLANT
WATER STERILE IRR 1000ML POUR (IV SOLUTION) ×2 IMPLANT

## 2020-11-29 NOTE — Anesthesia Preprocedure Evaluation (Addendum)
Anesthesia Evaluation  Patient identified by MRN, date of birth, ID band Patient awake    Reviewed: Allergy & Precautions, H&P , NPO status , Patient's Chart, lab work & pertinent test results  History of Anesthesia Complications Negative for: history of anesthetic complications  Airway Mallampati: III  TM Distance: >3 FB     Dental  (+) Teeth Intact   Pulmonary sleep apnea , neg COPD,    breath sounds clear to auscultation       Cardiovascular hypertension, (-) angina(-) Past MI and (-) Cardiac Stents + dysrhythmias (bradycardia)  Rhythm:regular Rate:Normal     Neuro/Psych PSYCHIATRIC DISORDERS Anxiety negative neurological ROS     GI/Hepatic Neg liver ROS, GERD  Controlled,  Endo/Other  Morbid obesity  Renal/GU      Musculoskeletal  (+) Arthritis ,   Abdominal   Peds  Hematology negative hematology ROS (+)   Anesthesia Other Findings Past Medical History: No date: Allergy No date: Arthritis     Comment:  knees No date: Essential hypertension, benign No date: GERD (gastroesophageal reflux disease) 04/24/2012: Near syncope No date: Other and unspecified hyperlipidemia No date: Other testicular hypofunction 11/06/2013: PVC (premature ventricular contraction) No date: Sleep apnea     Comment:  CPAP No date: Unspecified essential hypertension  Past Surgical History: 1991: arthroscopic      Comment:  left knee surgery No date: VASECTOMY     Comment:  [place] No date: WISDOM TOOTH EXTRACTION     Reproductive/Obstetrics negative OB ROS                            Anesthesia Physical Anesthesia Plan  ASA: III  Anesthesia Plan: General ETT   Post-op Pain Management:    Induction:   PONV Risk Score and Plan: Ondansetron, Dexamethasone, Midazolam and Treatment may vary due to age or medical condition  Airway Management Planned:   Additional  Equipment:   Intra-op Plan:   Post-operative Plan:   Informed Consent: I have reviewed the patients History and Physical, chart, labs and discussed the procedure including the risks, benefits and alternatives for the proposed anesthesia with the patient or authorized representative who has indicated his/her understanding and acceptance.     Dental Advisory Given  Plan Discussed with: Anesthesiologist, CRNA and Surgeon  Anesthesia Plan Comments: (Oral RAE)       Anesthesia Quick Evaluation

## 2020-11-29 NOTE — Anesthesia Procedure Notes (Signed)
Procedure Name: Intubation Date/Time: 11/29/2020 7:43 AM Performed by: Lily Peer, Noga Fogg, CRNA Pre-anesthesia Checklist: Patient identified, Emergency Drugs available, Suction available and Patient being monitored Patient Re-evaluated:Patient Re-evaluated prior to induction Oxygen Delivery Method: Circle system utilized Preoxygenation: Pre-oxygenation with 100% oxygen Induction Type: IV induction Ventilation: Two handed mask ventilation required and Oral airway inserted - appropriate to patient size Laryngoscope Size: McGraph and 4 Grade View: Grade I Tube type: Oral Rae Tube size: 8.0 mm Number of attempts: 2 (Attempted 7.5 oral rae, wasnt long enough, switchws to 8.0) Airway Equipment and Method: Oral airway Placement Confirmation: ETT inserted through vocal cords under direct vision,  positive ETCO2 and breath sounds checked- equal and bilateral Tube secured with: Tape Dental Injury: Teeth and Oropharynx as per pre-operative assessment

## 2020-11-29 NOTE — H&P (Signed)
The patient's history has been reviewed, patient examined, no change in status, stable for surgery.  Questions were answered to the patients satisfaction.  

## 2020-11-29 NOTE — Discharge Instructions (Signed)
[facility] [address] [place], [place]  [address] Phone:  [phone]   November 29, 2020  Patient: Devon Miranda  Date of Birth: 07-17-1971  Date of Visit: November 29, 2020    To Whom It May Concern:  Devon Miranda was seen and treated on November 29, 2020 and    .           If you have any questions or concerns, please don't hesitate to call.   Sincerely,       Treatment Team:  Attending Provider: Beverly Gust, MD       Sinus Endoscopy, Care After This sheet gives you information about how to care for yourself after your procedure. Your health care provider may also give you more specific instructions. If you have problems or questions, contact your health care provider. What can I expect after the procedure? After the procedure, it is common to have:  Temporary discomfort in the sinus area.  Minor bleeding.  Minor irritation or damage to the lining of the nose, mouth, and throat (mucous membranes). Depending on any treatments performed during your procedure, you may also have:  Sinus discomfort.  Headache.  Nasal stuffiness (congestion).  Nasal drainage.  Dry nasal passages. Follow these instructions at home:     Medicines  Take or use over-the-counter and prescription medicines only as told by your health care provider.  If you were prescribed an antibiotic medicine, use it as told by your health care provider. Do not stop using the antibiotic even if your condition improves.  Use nasal sprays and nasal rinses as told by your health care provider. General instructions  Avoid blowing your nose and sneezing.  Do not use any products that contain nicotine or tobacco, such as cigarettes and e-cigarettes. If you need help quitting, ask your health care provider.  Keep your head raised (elevated) for the first few nights after surgery, or as directed by your healthcare provider.  This helps to decrease inflammation.  Return to your normal activities as told by your health care provider. Ask your health care provider what activities are safe for you.  Keep all follow-up visits as told by your health care provider. This is important. Contact a health care provider if:  You have pain or discomfort that does not get better with over-the-counter medicine.  You have a fever.  You have more clear fluid or blood coming from your nose.  You have pus or a bad smell coming from your nose.  You have nausea and vomiting. Get help right away if:  You have bleeding from the nose that does not stop.  You have changes in your vision.  You cannot stop vomiting. Summary  After a sinus endoscopy, it is common to have temporary discomfort in the sinus area.  If you were prescribed an antibiotic medicine, use it as told by your health care provider. Do not stop using the antibiotic even if you start to feel better.  Do not use any products that contain nicotine or tobacco, such as cigarettes and e-cigarettes. If you need help quitting, ask your health care provider. This information is not intended to replace advice given to you by your health care provider. Make sure you discuss any questions you have with your health care provider. Document Revised: 03/27/2019 Document Reviewed: 04/19/2017 Elsevier Patient Education  [place]   1) The drugs that you were  given will stay in your system until tomorrow so for the next 24 hours you should not:  A) Drive an automobile B) Make any legal decisions C) Drink any alcoholic beverage   2) You may resume regular meals tomorrow.  Today it is better to start with liquids and gradually work up to solid foods.  You may eat anything you prefer, but it is better to start with liquids, then soup and crackers, and gradually work up to solid foods.   3) Please notify your doctor  immediately if you have any unusual bleeding, trouble breathing, redness and pain at the surgery site, drainage, fever, or pain not relieved by medication.    4) Additional Instructions:        Please contact your physician with any problems or Same Day Surgery at [phone], Monday through Friday 6 am to 4 pm, or  at [facility] number at [phone].

## 2020-11-29 NOTE — Op Note (Signed)
PREOPERATIVE DIAGNOSIS:  Chronic nasal obstruction.  POSTOPERATIVE DIAGNOSIS:  Chronic nasal obstruction.  SURGEON:  Roena Malady, M.D.  NAME OF PROCEDURE:  1. Nasal septoplasty. 2. Submucous resection of inferior turbinates.  OPERATIVE FINDINGS:  Severe nasal septal deformity, hypertrophy of the inferior turbinates.   DESCRIPTION OF THE PROCEDURE:  Devon Miranda was identified in the holding area and taken to the operating room and placed in the supine position.  After general endotracheal anesthesia was induced, the table was turned 45 degrees and the patient was placed in a semi-Fowler position.  The nose was then topically anesthetized with Lidocaine, cotton pledgets were placed within each nostril. After approximately 5 minutes, this was removed at which time a local anesthetic of 1% Lidocaine 1:100,000 units of Epinephrine was used to inject the inferior turbinates in the nasal septum. A total of 9 ml was used. Examination of the nose showed a severe right nasal septal deformity and tremendous hypertrophied inferior turbinate.  Beginning on the right hand side a hemitransfixion incision was then created on the leading edge of the septum on the right.  A subperichondrial plane was elevated posteriorly on the left and taken back to the perpendicular plate of the ethmoid where subperiosteal plane was elevated posteriorly on the left. A large septal curve was identified on the right hand side.  An inferior rim of cartilage was removed anteriorly with care taken to leave an anterior strut to prevent nasal collapse. With this strut removed the perpendicular plate of the ethmoid was separated from the quadrangular cartilage. The large septal curve was removed.  The septum was then replaced in the midline. Reinspection through each nostril showed excellent reduction of the septal deformity. A left posterior inferior fenestration was then created to allow hematoma drainage.  With the septoplasty  completed, beginning on the left-hand side, a 15 blade was used to incise along the inferior edge of the inferior turbinate. A superior laterally based flap was then elevated. The underlying conchal bone of mucosa was excised using Knight scissors. The flap was then laid back over the turbinate stump and cauterized using suction cautery. In a similar fashion the submucous resection was performed on the right.  With the submucous resection completed bilaterally and no active bleeding, the hemitransfixion incision was then closed using two interrupted 3-0 chromic sutures.  Plastic nasal septal splints were placed within each nostril and affixed to the septum using a 3-0 nylon suture. Stammberger was then used beneath each inferior turbinate for hemostasis.    The patient tolerated the procedure well, was returned to anesthesia, extubated in the operating room, and taken to the recovery room in stable condition.    CULTURES:  None.  SPECIMENS:  None.  ESTIMATED BLOOD LOSS:  25 cc.  Roena Malady  11/29/2020  8:36 AM

## 2020-11-29 NOTE — Transfer of Care (Signed)
Immediate Anesthesia Transfer of Care Note  Patient: Devon Miranda  Procedure(s) Performed: NASAL SEPTOPLASTY WITH SMR TURBINATE REDUCTION (Bilateral )  Patient Location: PACU  Anesthesia Type:General  Level of Consciousness: awake, alert  and oriented  Airway & Oxygen Therapy: Patient Spontanous Breathing and Patient connected to face mask oxygen  Post-op Assessment: Report given to RN and Post -op Vital signs reviewed and stable  Post vital signs: Reviewed and stable  Last Vitals:  Vitals Value Taken Time  BP 146/85 11/29/20 0844  Temp    Pulse 72 11/29/20 0847  Resp 17 11/29/20 0847  SpO2 100 % 11/29/20 0847  Vitals shown include unvalidated device data.  Last Pain:  Vitals:   11/29/20 0617  TempSrc: Oral  PainSc: 3          Complications: No complications documented.

## 2020-11-30 NOTE — Anesthesia Postprocedure Evaluation (Signed)
Anesthesia Post Note  Patient: Quinzell Malcomb  Procedure(s) Performed: NASAL SEPTOPLASTY WITH SMR TURBINATE REDUCTION (Bilateral )  Patient location during evaluation: PACU Anesthesia Type: General Level of consciousness: awake and alert Pain management: pain level controlled Vital Signs Assessment: post-procedure vital signs reviewed and stable Respiratory status: spontaneous breathing, nonlabored ventilation and respiratory function stable Cardiovascular status: blood pressure returned to baseline and stable Postop Assessment: no apparent nausea or vomiting Anesthetic complications: no   No complications documented.   Last Vitals:  Vitals:   11/29/20 0937 11/29/20 1025  BP: (!) 150/82 (!) 143/81  Pulse: 65 66  Resp: 16 16  Temp: (!) 36.2 C 36.6 C  SpO2: 95% 97%    Last Pain:  Vitals:   11/29/20 1025  TempSrc: Temporal  PainSc: 0-No pain                 Brett Canales Stuti Sandin

## 2021-01-19 DIAGNOSIS — Z48813 Encounter for surgical aftercare following surgery on the respiratory system: Secondary | ICD-10-CM | POA: Diagnosis not present

## 2021-01-25 DIAGNOSIS — G4733 Obstructive sleep apnea (adult) (pediatric): Secondary | ICD-10-CM | POA: Diagnosis not present

## 2021-02-16 ENCOUNTER — Telehealth: Payer: Self-pay

## 2021-02-16 ENCOUNTER — Other Ambulatory Visit: Payer: Self-pay | Admitting: Physician Assistant

## 2021-02-16 NOTE — Telephone Encounter (Signed)
Requested medication (s) are due for refill today - yes  Requested medication (s) are on the active medication list -yes  Future visit scheduled -yes  Last refill: 10/10/20  Notes to clinic: Patient fauls visit protocol- has appointment scheduled 06/28/21- is that too far out for follow up? Sent for review  Requested Prescriptions  Pending Prescriptions Disp Refills   lisinopril-hydrochlorothiazide (ZESTORETIC) 20-12.5 MG tablet [Pharmacy Med Name: LISINOPRIL-HCTZ 20-12.5 MG 20-12.5 Tablet] 90 tablet 0    Sig: TAKE 1 TABLET BY MOUTH DAILY.      Cardiovascular:  ACEI + Diuretic Combos Failed - 02/16/2021  2:57 PM      Failed - Last BP in normal range    BP Readings from Last 1 Encounters:  11/29/20 (!) 143/81          Failed - Valid encounter within last 6 months    Recent Outpatient Visits           7 months ago Annual physical exam   [facility] Carles Collet M, [place]   1 year ago Close exposure to COVID-19 virus   [facility], Wendee Beavers, PA-C   1 year ago Neck pain   [facility] Carles Collet M, [place]   1 year ago Essential hypertension   [facility] Trinna Post, [place]       Future Appointments             In 4 months Trinna Post, PA-C [employer]             Passed - Na in normal range and within 180 days    Sodium  Date Value Ref Range Status  11/25/2020 137 135 - 145 mmol/L Final  06/24/2020 142 134 - 144 mmol/L Final  03/31/2012 138 136 - 145 mmol/L Final          Passed - K in normal range and within 180 days    Potassium  Date Value Ref Range Status  11/25/2020 3.8 3.5 - 5.1 mmol/L Final  03/31/2012 3.2 (L) 3.5 - 5.1 mmol/L Final          Passed - Cr in normal range and within 180 days    Creatinine  Date Value Ref Range Status  03/31/2012 1.00 0.60 - 1.30 mg/dL Final   Creatinine, Ser  Date Value Ref Range Status  11/25/2020 0.75 0.61 - 1.24  mg/dL Final          Passed - Ca in normal range and within 180 days    Calcium  Date Value Ref Range Status  11/25/2020 8.9 8.9 - 10.3 mg/dL Final   Calcium, Total  Date Value Ref Range Status  03/31/2012 8.4 (L) 8.5 - 10.1 mg/dL Final          Passed - Patient is not pregnant          Requested Prescriptions  Pending Prescriptions Disp Refills   lisinopril-hydrochlorothiazide (ZESTORETIC) 20-12.5 MG tablet [Pharmacy Med Name: LISINOPRIL-HCTZ 20-12.5 MG 20-12.5 Tablet] 90 tablet 0    Sig: TAKE 1 TABLET BY MOUTH DAILY.      Cardiovascular:  ACEI + Diuretic Combos Failed - 02/16/2021  2:57 PM      Failed - Last BP in normal range    BP Readings from Last 1 Encounters:  11/29/20 (!) 143/81          Failed - Valid encounter within last 6 months    Recent Outpatient Visits  7 months ago Annual physical exam   [facility] Carles Collet M, [place]   1 year ago Close exposure to COVID-19 virus   [facility], Wendee Beavers, [place]   1 year ago Neck pain   [facility] Carles Collet M, [place]   1 year ago Essential hypertension   [facility] Trinna Post, [place]       Future Appointments             In 4 months Trinna Post, PA-C [employer]             Passed - Na in normal range and within 180 days    Sodium  Date Value Ref Range Status  11/25/2020 137 135 - 145 mmol/L Final  06/24/2020 142 134 - 144 mmol/L Final  03/31/2012 138 136 - 145 mmol/L Final          Passed - K in normal range and within 180 days    Potassium  Date Value Ref Range Status  11/25/2020 3.8 3.5 - 5.1 mmol/L Final  03/31/2012 3.2 (L) 3.5 - 5.1 mmol/L Final          Passed - Cr in normal range and within 180 days    Creatinine  Date Value Ref Range Status  03/31/2012 1.00 0.60 - 1.30 mg/dL Final   Creatinine, Ser  Date Value Ref Range Status  11/25/2020 0.75 0.61 - 1.24 mg/dL  Final          Passed - Ca in normal range and within 180 days    Calcium  Date Value Ref Range Status  11/25/2020 8.9 8.9 - 10.3 mg/dL Final   Calcium, Total  Date Value Ref Range Status  03/31/2012 8.4 (L) 8.5 - 10.1 mg/dL Final          Passed - Patient is not pregnant

## 2021-02-16 NOTE — Telephone Encounter (Signed)
Pt called and an appt has been made for HTN med refill.

## 2021-02-21 ENCOUNTER — Other Ambulatory Visit: Payer: Self-pay | Admitting: Physician Assistant

## 2021-02-21 ENCOUNTER — Encounter: Payer: Self-pay | Admitting: Physician Assistant

## 2021-02-21 ENCOUNTER — Other Ambulatory Visit: Payer: Self-pay

## 2021-02-21 ENCOUNTER — Ambulatory Visit (INDEPENDENT_AMBULATORY_CARE_PROVIDER_SITE_OTHER): Payer: 59 | Admitting: Physician Assistant

## 2021-02-21 VITALS — BP 135/81 | HR 70 | Temp 98.0°F | Wt 352.9 lb

## 2021-02-21 DIAGNOSIS — E781 Pure hyperglyceridemia: Secondary | ICD-10-CM

## 2021-02-21 DIAGNOSIS — I10 Essential (primary) hypertension: Secondary | ICD-10-CM

## 2021-02-21 MED ORDER — ATORVASTATIN CALCIUM 10 MG PO TABS: 10.0000 mg | ORAL_TABLET | Freq: Every day | ORAL | 1 refills | Status: DC

## 2021-02-21 MED ORDER — LISINOPRIL-HYDROCHLOROTHIAZIDE 20-12.5 MG PO TABS: 1.0000 | ORAL_TABLET | Freq: Every day | ORAL | 1 refills | Status: DC

## 2021-02-21 MED ORDER — MELOXICAM 15 MG PO TABS: 15.0000 mg | ORAL_TABLET | Freq: Every day | ORAL | 1 refills | Status: DC

## 2021-02-21 NOTE — Patient Instructions (Signed)
High Cholesterol  High cholesterol is a condition in which the blood has high levels of a white, waxy substance similar to fat (cholesterol). The liver makes all the cholesterol that the body needs. The human body needs small amounts of cholesterol to help build cells. A person gets extra or excess cholesterol from the food that he or she eats. The blood carries cholesterol from the liver to the rest of the body. If you have high cholesterol, deposits (plaques) may build up on the walls of your arteries. Arteries are the blood vessels that carry blood away from your heart. These plaques make the arteries narrow and stiff. Cholesterol plaques increase your risk for heart attack and stroke. Work with your health care provider to keep your cholesterol levels in a healthy range. What increases the risk? The following factors may make you more likely to develop this condition:  Eating foods that are high in animal fat (saturated fat) or cholesterol.  Being overweight.  Not getting enough exercise.  A family history of high cholesterol (familial hypercholesterolemia).  Use of tobacco products.  Having diabetes. What are the signs or symptoms? There are no symptoms of this condition. How is this diagnosed? This condition may be diagnosed based on the results of a blood test.  If you are older than 51 years of age, your health care provider may check your cholesterol levels every 4-6 years.  You may be checked more often if you have high cholesterol or other risk factors for heart disease. The blood test for cholesterol measures:  "Bad" cholesterol, or LDL cholesterol. This is the main type of cholesterol that causes heart disease. The desired level is less than 100 mg/dL.  "Good" cholesterol, or HDL cholesterol. HDL helps protect against heart disease by cleaning the arteries and carrying the LDL to the liver for processing. The desired level for HDL is 60 mg/dL or higher.  Triglycerides.  These are fats that your body can store or burn for energy. The desired level is less than 150 mg/dL.  Total cholesterol. This measures the total amount of cholesterol in your blood and includes LDL, HDL, and triglycerides. The desired level is less than 200 mg/dL. How is this treated? This condition may be treated with:  Diet changes. You may be asked to eat foods that have more fiber and less saturated fats or added sugar.  Lifestyle changes. These may include regular exercise, maintaining a healthy weight, and quitting use of tobacco products.  Medicines. These are given when diet and lifestyle changes have not worked. You may be prescribed a statin medicine to help lower your cholesterol levels. Follow these instructions at home: Eating and drinking  Eat a healthy, balanced diet. This diet includes: ? Daily servings of a variety of fresh, frozen, or canned fruits and vegetables. ? Daily servings of whole grain foods that are rich in fiber. ? Foods that are low in saturated fats and trans fats. These include poultry and fish without skin, lean cuts of meat, and low-fat dairy products. ? A variety of fish, especially oily fish that contain omega-3 fatty acids. Aim to eat fish at least 2 times a week.  Avoid foods and drinks that have added sugar.  Use healthy cooking methods, such as roasting, grilling, broiling, baking, poaching, steaming, and stir-frying. Do not fry your food except for stir-frying.   Lifestyle  Get regular exercise. Aim to exercise for a total of 150 minutes a week. Increase your activity level by doing activities  such as gardening, walking, and taking the stairs.  Do not use any products that contain nicotine or tobacco, such as cigarettes, e-cigarettes, and chewing tobacco. If you need help quitting, ask your health care provider.   General instructions  Take over-the-counter and prescription medicines only as told by your health care provider.  Keep all  follow-up visits as told by your health care provider. This is important. Where to find more information  American Heart Association: www.heart.org  National Heart, Lung, and Blood Institute: https://wilson-eaton.com/ Contact a health care provider if:  You have trouble achieving or maintaining a healthy diet or weight.  You are starting an exercise program.  You are unable to stop smoking. Get help right away if:  You have chest pain.  You have trouble breathing.  You have any symptoms of a stroke. "BE FAST" is an easy way to remember the main warning signs of a stroke: ? B - Balance. Signs are dizziness, sudden trouble walking, or loss of balance. ? E - Eyes. Signs are trouble seeing or a sudden change in vision. ? F - Face. Signs are sudden weakness or numbness of the face, or the face or eyelid drooping on one side. ? A - Arms. Signs are weakness or numbness in an arm. This happens suddenly and usually on one side of the body. ? S - Speech. Signs are sudden trouble speaking, slurred speech, or trouble understanding what people say. ? T - Time. Time to call emergency services. Write down what time symptoms started.  You have other signs of a stroke, such as: ? A sudden, severe headache with no known cause. ? Nausea or vomiting. ? Seizure. These symptoms may represent a serious problem that is an emergency. Do not wait to see if the symptoms will go away. Get medical help right away. Call your local emergency services (911 in the [place] Do not drive yourself to the hospital. Summary  Cholesterol plaques increase your risk for heart attack and stroke. Work with your health care provider to keep your cholesterol levels in a healthy range.  Eat a healthy, balanced diet, get regular exercise, and maintain a healthy weight.  Do not use any products that contain nicotine or tobacco, such as cigarettes, e-cigarettes, and chewing tobacco.  Get help right away if you have any symptoms of a  stroke. This information is not intended to replace advice given to you by your health care provider. Make sure you discuss any questions you have with your health care provider. Document Revised: 11/02/2019 Document Reviewed: 11/02/2019 Elsevier Patient Education  2021 [employer].

## 2021-02-21 NOTE — Progress Notes (Signed)
Established patient visit   Patient: Devon Miranda   DOB: Jun 25, 1971   50 y.o. Male  MRN: [id number] Visit Date: 02/21/2021  Today's healthcare provider: Trinna Post, PA-C   Chief Complaint  Patient presents with  . Hypertension   Subjective    HPI  Hypertension, follow-up  BP Readings from Last 3 Encounters:  02/21/21 135/81  11/29/20 (!) 143/81  09/20/20 (!) 150/84   Wt Readings from Last 3 Encounters:  02/21/21 (!) 352 lb 14.4 oz (160.1 kg)  11/24/20 (!) 330 lb (149.7 kg)  09/20/20 (!) 348 lb (157.9 kg)     He was last seen for hypertension 8 months ago.  BP at that visit was 138/82. Management since that visit includes continue current medication.  He reports good compliance with treatment. He is not having side effects.  He is following a Regular diet. He is not exercising. He does not smoke.  Use of agents associated with hypertension: none.   Outside blood pressures are not being checked. Symptoms: No chest pain No chest pressure  No palpitations No syncope  No dyspnea No orthopnea  No paroxysmal nocturnal dyspnea No lower extremity edema   Pertinent labs: Lab Results  Component Value Date   CHOL 193 06/24/2020   HDL 45 06/24/2020   LDLCALC 120 (H) 06/24/2020   LDLDIRECT 108.0 12/04/2018   TRIG 158 (H) 06/24/2020   CHOLHDL 4.3 06/24/2020   Lab Results  Component Value Date   NA 137 11/25/2020   K 3.8 11/25/2020   CREATININE 0.75 11/25/2020   GFRNONAA >60 11/25/2020   GFRAA 117 06/24/2020   GLUCOSE 105 (H) 11/25/2020     The 10-year ASCVD risk score Mikey Bussing DC Jr., et al., 2013) is: 4.7%    Obesity: Reports he drinks coffee in the morning. Starts eating around 12 pm with three fried eggs, mushrooms at home. If he is on the road, will get a sub or salad from somewhere. Will eat dinner - source of protein. Eats pizza once a week. Reports he doesn't eat fast food frequently, may eat chick fil a there and will get chicken nuggets.  Reports he rarely drinks soda and sweet tea. Drinks gatorade - once to twice per week. Reports juice is rare. Reports he drinks milk frequently - 20- 30 ounces per day. Reports he doesn't really snack. Eats nothing between meals - reports he eats almonds. Reports he doesn't eat sweets or candies. Reports he has been slightly less active due to his feet hurting him. 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   Wt Readings from Last 3 Encounters:  02/21/21 (!) 352 lb 14.4 oz (160.1 kg)  11/24/20 (!) 330 lb (149.7 kg)  09/20/20 (!) 348 lb (157.9 kg)    --------------------------------------------------------------------------------------------------- Lipid/Cholesterol, Follow-up  Last lipid panel Other pertinent labs  Lab Results  Component Value Date   CHOL 193 06/24/2020   HDL 45 06/24/2020   LDLCALC 120 (H) 06/24/2020   LDLDIRECT 108.0 12/04/2018   TRIG 158 (H) 06/24/2020   CHOLHDL 4.3 06/24/2020   Lab Results  Component Value Date   ALT 15 06/24/2020   AST 15 06/24/2020   PLT 254 06/24/2020   TSH 2.040 06/24/2020     He was last seen for this 6 months ago.  Management since that visit includes fenfofibrate.  He reports excellent compliance with treatment. He is not having side effects.   Symptoms: No chest pain No chest pressure/discomfort  No dyspnea No lower  extremity edema  No numbness or tingling of extremity No orthopnea  No palpitations No paroxysmal nocturnal dyspnea  No speech difficulty No syncope   Current diet: well balanced Current exercise: none  The 10-year ASCVD risk score Mikey Bussing DC Jr., et al., 2013) is: 4.7%  --------------------------------------------------------------------------------------------------- He is having foot pain in his right foot. Previously treated for left foot plantar fasciitis with injection at podiatrist.      Medications: Outpatient Medications Prior to Visit  Medication Sig  . Albuterol Sulfate (PROAIR RESPICLICK) 196 (90  Base) MCG/ACT AEPB Inhale 1-2 puffs into the lungs every 6 (six) hours as needed.  Marland Kitchen ascorbic acid (VITAMIN C) 500 MG tablet Take 1,500 mg by mouth 2 (two) times daily.  . Coenzyme Q10 (CO Q 10) 100 MG CAPS Take 100 mg by mouth daily.   . fluticasone (FLONASE) 50 MCG/ACT nasal spray Place 1 spray into both nostrils daily.  . Glucosamine HCl 1500 MG TABS Take 3,000 mg by mouth daily.  . Magnesium 400 MG TABS Take 400 mg by mouth daily.  . montelukast (SINGULAIR) 10 MG tablet Take 1 tablet (10 mg total) by mouth at bedtime. (Patient taking differently: Take 10 mg by mouth daily as needed (allergies).)  . Multiple Vitamin (MULTIVITAMIN) capsule Take 1 capsule daily by mouth.  . naproxen sodium (ALEVE) 220 MG tablet Take 440 mg by mouth daily as needed (pain).  . Omega-3 Fatty Acids (FISH OIL) 1200 MG CPDR Take 3,600 mg by mouth daily. Takes 4 tablets daily.  . [DISCONTINUED] fenofibrate 160 MG tablet TAKE 1 TABLET BY MOUTH DAILY. (Patient taking differently: Take 160 mg by mouth daily.)  . [DISCONTINUED] lisinopril-hydrochlorothiazide (ZESTORETIC) 20-12.5 MG tablet TAKE 1 TABLET BY MOUTH DAILY  . [DISCONTINUED] meloxicam (MOBIC) 15 MG tablet Take 1 tablet (15 mg total) by mouth daily.  . [DISCONTINUED] oxyCODONE-acetaminophen (PERCOCET) 5-325 MG tablet Take 1 tablet by mouth every 4 (four) hours as needed for severe pain. (Patient not taking: Reported on 02/21/2021)  . [DISCONTINUED] sulfamethoxazole-trimethoprim (BACTRIM DS) 800-160 MG tablet Take 1 tablet by mouth 2 (two) times daily. (Patient not taking: Reported on 02/21/2021)   No facility-administered medications prior to visit.    Review of Systems  Constitutional: Negative.   Respiratory: Negative.   Cardiovascular: Negative.   Hematological: Negative.        Objective    BP 135/81 (BP Location: Right Arm, Patient Position: Sitting, Cuff Size: Large)   Pulse 70   Temp 98 F (36.7 C) (Oral)   Wt (!) 352 lb 14.4 oz (160.1 kg)   SpO2  100%   BMI 46.56 kg/m     Physical Exam Constitutional:      Appearance: Normal appearance. He is obese.  Cardiovascular:     Rate and Rhythm: Normal rate and regular rhythm.     Heart sounds: Normal heart sounds.  Pulmonary:     Effort: Pulmonary effort is normal.     Breath sounds: Normal breath sounds.  Skin:    General: Skin is warm and dry.  Neurological:     General: No focal deficit present.     Mental Status: He is alert and oriented to person, place, and time.  Psychiatric:        Mood and Affect: Mood normal.        Behavior: Behavior normal.       No results found for any visits on 02/21/21.  Assessment & Plan    1. Primary hypertension  Continue this, mobic  for plantar fasciitis but advised sparing use. Advised seeing podiatrist if able.   - lisinopril-hydrochlorothiazide (ZESTORETIC) 20-12.5 MG tablet; Take 1 tablet by mouth daily.  Dispense: 90 tablet; Refill: 1 - meloxicam (MOBIC) 15 MG tablet; Take 1 tablet (15 mg total) by mouth daily.  Dispense: 30 tablet; Refill: 1  2. Morbid obesity ([place])  - Amb Referral to Bariatric Surgery  3. Hypertriglyceridemia  Switch fenofibrate to lipitor 10 mg nightly.   - atorvastatin (LIPITOR) 10 MG tablet; Take 1 tablet (10 mg total) by mouth daily.  Dispense: 90 tablet; Refill: 1   Return in about 6 months (around 08/24/2021).      ITrinna Post, PA-C, have reviewed all documentation for this visit. The documentation on 02/22/21 for the exam, diagnosis, procedures, and orders are all accurate and complete.  The entirety of the information documented in the History of Present Illness, Review of Systems and Physical Exam were personally obtained by me. Portions of this information were initially documented by [facility] and reviewed by me for thoroughness and accuracy.     Paulene Floor  [facility] [phone] (phone) [phone] (fax)  [place]

## 2021-03-10 ENCOUNTER — Other Ambulatory Visit: Payer: Self-pay | Admitting: Podiatry

## 2021-03-10 ENCOUNTER — Ambulatory Visit (INDEPENDENT_AMBULATORY_CARE_PROVIDER_SITE_OTHER): Payer: 59 | Admitting: Podiatry

## 2021-03-10 ENCOUNTER — Ambulatory Visit (INDEPENDENT_AMBULATORY_CARE_PROVIDER_SITE_OTHER): Payer: 59

## 2021-03-10 ENCOUNTER — Encounter: Payer: Self-pay | Admitting: Podiatry

## 2021-03-10 ENCOUNTER — Other Ambulatory Visit: Payer: Self-pay

## 2021-03-10 DIAGNOSIS — M722 Plantar fascial fibromatosis: Secondary | ICD-10-CM

## 2021-03-10 DIAGNOSIS — M659 Synovitis and tenosynovitis, unspecified: Secondary | ICD-10-CM

## 2021-03-10 DIAGNOSIS — M7672 Peroneal tendinitis, left leg: Secondary | ICD-10-CM

## 2021-03-10 MED ORDER — MELOXICAM 15 MG PO TABS
15.0000 mg | ORAL_TABLET | Freq: Every day | ORAL | 1 refills | Status: DC
Start: 2021-03-10 — End: 2021-03-10

## 2021-03-10 MED ORDER — BETAMETHASONE SOD PHOS & ACET 6 (3-3) MG/ML IJ SUSP
3.0000 mg | Freq: Once | INTRAMUSCULAR | Status: AC
  Administered 2021-03-10: 3 mg via INTRA_ARTICULAR

## 2021-03-10 MED ORDER — METHYLPREDNISOLONE 4 MG PO TBPK: ORAL_TABLET | ORAL | 0 refills | Status: DC

## 2021-03-10 NOTE — Progress Notes (Signed)
   Subjective: 50 y.o. male established patient presenting for a new complaint of bilateral foot pain. Patient states that for the past 6 weeks he began to notice increased heel pain. He denies a history of injury. A few days after pain in his right foot, he began to notice increased pain and tenderness to the left ankle as well, likely due to compensation. He has been trying to wear a CAM boot to the right foot, but he states that it is throwing off his gait. He presents for further treatment and evaluation.    Past Medical History:  Diagnosis Date  . Allergy   . Arthritis    knees  . Essential hypertension, benign   . GERD (gastroesophageal reflux disease)   . Near syncope 04/24/2012  . Other and unspecified hyperlipidemia   . Other testicular hypofunction   . PVC (premature ventricular contraction) 11/06/2013  . Sleep apnea    CPAP  . Unspecified essential hypertension      Objective: Physical Exam General: The patient is alert and oriented x3 in no acute distress.  Dermatology: Skin is warm, dry and supple bilateral lower extremities. Negative for open lesions or macerations bilateral.   Vascular: Dorsalis Pedis and Posterior Tibial pulses palpable bilateral.  Capillary fill time is immediate to all digits.  Neurological: Epicritic and protective threshold intact bilateral.   Musculoskeletal: Tenderness to palpation to the plantar aspect of the right heel along the plantar fascia with a palpable lump. Pain to palpation also noted to the medial aspect of the left ankle joint. All other joints range of motion within normal limits bilateral. Strength 5/5 in all groups bilateral.   Radiographic exam: Normal osseous mineralization. Joint spaces preserved. No fracture/dislocation/boney destruction. No other soft tissue abnormalities or radiopaque foreign bodies.   Assessment: 1. Plantar fasciitis/possible plantar fascial rupture right 2. Ankle capsulitis medial aspect left  Plan  of Care:  1. Patient evaluated. Xrays reviewed.   2. Injection of 0.5cc Celestone soluspan injected into the right plantar fascia and left ankle medial aspect 3. Rx for Medrol Dose Pack placed 4. Rx for Meloxicam ordered for patient. 5. Ankle brace dispesed for left ankle support. Compression anklet dispensed to wear to the RT foot 6. Instructed patient regarding therapies and modalities at home to alleviate symptoms.  7. Continue [employer] boost running shoes. 8. Return to clinic in 4 weeks.    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 Edrick Kins, DPM    [address], [place] [address]                Office [phone]  Fax [phone]

## 2021-03-20 ENCOUNTER — Encounter: Payer: Self-pay | Admitting: Family Medicine

## 2021-03-21 ENCOUNTER — Other Ambulatory Visit: Payer: Self-pay

## 2021-03-21 ENCOUNTER — Encounter: Payer: Self-pay | Admitting: Podiatry

## 2021-03-21 NOTE — Telephone Encounter (Signed)
I cant tell what he wants, but ok to place ortho referral or schedule appt with [place].

## 2021-03-29 ENCOUNTER — Encounter: Payer: Self-pay | Admitting: Family Medicine

## 2021-03-29 ENCOUNTER — Other Ambulatory Visit: Payer: Self-pay

## 2021-03-29 ENCOUNTER — Ambulatory Visit: Payer: 59 | Admitting: Family Medicine

## 2021-03-29 VITALS — BP 150/89 | HR 70 | Temp 98.6°F | Resp 16 | Ht 73.0 in | Wt 341.0 lb

## 2021-03-29 DIAGNOSIS — M722 Plantar fascial fibromatosis: Secondary | ICD-10-CM

## 2021-03-29 NOTE — Patient Instructions (Signed)
Plantar Fasciitis  Plantar fasciitis is a painful foot condition that affects the heel. It occurs when the band of tissue that connects the toes to the heel bone (plantar fascia) becomes irritated. This can happen as the result of exercising too much or doing other repetitive activities (overuse injury). Plantar fasciitis can cause mild irritation to severe pain that makes it difficult to walk or move. The pain is usually worse in the morning after sleeping, or after sitting or lying down for a period of time. Pain may also be worse after long periods of walking or standing. What are the causes? This condition may be caused by:  Standing for long periods of time.  Wearing shoes that do not have good arch support.  Doing activities that put stress on joints (high-impact activities). This includes ballet and exercise that makes your heart beat faster (aerobic exercise), such as running.  Being overweight.  An abnormal way of walking (gait).  Tight muscles in the back of your lower leg (calf).  High arches in your feet or flat feet.  Starting a new athletic activity. What are the signs or symptoms? The main symptom of this condition is heel pain. Pain may get worse after the following:  Taking the first steps after a time of rest, especially in the morning after awakening, or after you have been sitting or lying down for a while.  Long periods of standing still. Pain may decrease after 30-45 minutes of activity, such as gentle walking. How is this diagnosed? This condition may be diagnosed based on your medical history, a physical exam, and your symptoms. Your health care provider will check for:  A tender area on the bottom of your foot.  A high arch in your foot or flat feet.  Pain when you move your foot.  Difficulty moving your foot. You may have imaging tests to confirm the diagnosis, such as:  X-rays.  Ultrasound.  MRI. How is this treated? Treatment for plantar  fasciitis depends on how severe your condition is. Treatment may include:  Rest, ice, pressure (compression), and raising (elevating) the affected foot. This is called RICE therapy. Your health care provider may recommend RICE therapy along with over-the-counter pain medicines to manage your pain.  Exercises to stretch your calves and your plantar fascia.  A splint that holds your foot in a stretched, upward position while you sleep (night splint).  Physical therapy to relieve symptoms and prevent problems in the future.  Injections of steroid medicine (cortisone) to relieve pain and inflammation.  Stimulating your plantar fascia with electrical impulses (extracorporeal shock wave therapy). This is usually the last treatment option before surgery.  Surgery, if other treatments have not worked after 12 months. Follow these instructions at home: Managing pain, stiffness, and swelling  If directed, put ice on the painful area. To do this: ? Put ice in a plastic bag, or use a frozen bottle of water. ? Place a towel between your skin and the bag or bottle. ? Roll the bottom of your foot over the bag or bottle. ? Do this for 20 minutes, 2-3 times a day.  Wear athletic shoes that have air-sole or gel-sole cushions, or try soft shoe inserts that are designed for plantar fasciitis.  Elevate your foot above the level of your heart while you are sitting or lying down.   Activity  Avoid activities that cause pain. Ask your health care provider what activities are safe for you.  Do physical therapy exercises  and stretches as told by your health care provider.  Try activities and forms of exercise that are easier on your joints (low impact). Examples include swimming, water aerobics, and biking. General instructions  Take over-the-counter and prescription medicines only as told by your health care provider.  Wear a night splint while sleeping, if told by your health care provider. Loosen the  splint if your toes tingle, become numb, or turn cold and blue.  Maintain a healthy weight, or work with your health care provider to lose weight as needed.  Keep all follow-up visits. This is important. Contact a health care provider if you have:  Symptoms that do not go away with home treatment.  Pain that gets worse.  Pain that affects your ability to move or do daily activities. Summary  Plantar fasciitis is a painful foot condition that affects the heel. It occurs when the band of tissue that connects the toes to the heel bone (plantar fascia) becomes irritated.  Heel pain is the main symptom of this condition. It may get worse after exercising too much or standing still for a long time.  Treatment varies, but it usually starts with rest, ice, pressure (compression), and raising (elevating) the affected foot. This is called RICE therapy. Over-the-counter medicines can also be used to manage pain. This information is not intended to replace advice given to you by your health care provider. Make sure you discuss any questions you have with your health care provider. Document Revised: 03/21/2020 Document Reviewed: 03/21/2020 Elsevier Patient Education  2021 [employer].

## 2021-03-29 NOTE — Progress Notes (Signed)
Established patient visit   Patient: Devon Miranda   DOB: [birth year]   50 y.o. Male  MRN: [id number] Visit Date: 03/29/2021  Today's healthcare provider: Laurita Quint Traeh Milroy, FNP   Chief Complaint  Patient presents with  . Foot Pain   Subjective    Foot Pain This is a new problem. The current episode started more than 1 month ago. The problem occurs constantly. The problem has been gradually worsening. Associated symptoms include arthralgias, myalgias and numbness.    Patient reports that he saw Dr. Amalia Hailey a few weeks ago and he believed that he has plantar fascitis. Pain was originally in his left foot, now it is in his right foot. Patient reports that he was treated with prednisone and mobic. He reports that it has helped a little. He feels now that it could be gout. Patient denies any injuries.   Injections helped for 4 days Discussed ruling out gout Is getting swelling in both ankles Pain is as high as 8-9/10 On Right foot fascia is exposed Podiatry suggested MRI Will see podiatry next week Bilateral Xrays done to feet   Medications: Outpatient Medications Prior to Visit  Medication Sig  . Albuterol Sulfate (PROAIR RESPICLICK) 841 (90 Base) MCG/ACT AEPB Inhale 1-2 puffs into the lungs every 6 (six) hours as needed.  Marland Kitchen ascorbic acid (VITAMIN C) 500 MG tablet Take 1,500 mg by mouth 2 (two) times daily.  Marland Kitchen atorvastatin (LIPITOR) 10 MG tablet TAKE 1 TABLET BY MOUTH DAILY.  Marland Kitchen Coenzyme Q10 (CO Q 10) 100 MG CAPS Take 100 mg by mouth daily.   . fluticasone (FLONASE) 50 MCG/ACT nasal spray USE 2 SPRAYS IN EACH NOSTRIL ONCE DAILY  . Glucosamine HCl 1500 MG TABS Take 3,000 mg by mouth daily.  Marland Kitchen lisinopril-hydrochlorothiazide (ZESTORETIC) 20-12.5 MG tablet TAKE 1 TABLET BY MOUTH DAILY.  . Magnesium 400 MG TABS Take 400 mg by mouth daily.  . meloxicam (MOBIC) 15 MG tablet TAKE 1 TABLET BY MOUTH DAILY.  . Multiple Vitamin (MULTIVITAMIN) capsule Take 1 capsule daily by mouth.  .  naproxen sodium (ALEVE) 220 MG tablet Take 440 mg by mouth daily as needed (pain).  . Omega-3 Fatty Acids (FISH OIL) 1200 MG CPDR Take 3,600 mg by mouth daily. Takes 4 tablets daily.  . methylPREDNISolone (MEDROL DOSEPAK) 4 MG TBPK tablet TAKE ALL 6 TABLETS BY MOUTH ON DAY 1, THEN DECREASE BY ONE TABLET EACH DAY (6-5-4-3-2-1) (Patient not taking: Reported on 03/29/2021)   No facility-administered medications prior to visit.    Review of Systems  Cardiovascular: Negative for leg swelling.  Musculoskeletal: Positive for arthralgias and myalgias.  Neurological: Positive for numbness.       Objective    BP (!) 150/89   Pulse 70   Temp 98.6 F (37 C)   Resp 16   Ht 6\' 1"  (1.854 m)   Wt (!) 341 lb (154.7 kg)   BMI 44.99 kg/m     Physical Exam Vitals reviewed.  Constitutional:      Appearance: Normal appearance.  HENT:     Head: Normocephalic and atraumatic.  Eyes:     Conjunctiva/sclera: Conjunctivae normal.     Pupils: Pupils are equal, round, and reactive to light.  Cardiovascular:     Rate and Rhythm: Normal rate and regular rhythm.     Pulses: Normal pulses.     Heart sounds: Normal heart sounds. No murmur heard. No friction rub. No gallop.   Pulmonary:  Effort: Pulmonary effort is normal. No respiratory distress.     Breath sounds: Normal breath sounds. No stridor. No wheezing or rales.  Abdominal:     General: Bowel sounds are normal.     Palpations: Abdomen is soft.     Tenderness: There is no abdominal tenderness.  Musculoskeletal:        General: Tenderness (bilateral feet) present.     Right lower leg: Edema present.     Left lower leg: Edema present.     Right ankle: Swelling present. Decreased range of motion.     Left ankle: Swelling present. Decreased range of motion.     Right foot: Decreased range of motion. Swelling and tenderness present. Normal pulse.     Left foot: Decreased range of motion. Swelling and tenderness present. Normal pulse.   Skin:    General: Skin is warm and dry.  Neurological:     General: No focal deficit present.     Mental Status: He is alert and oriented to person, place, and time.  Psychiatric:        Mood and Affect: Mood normal.        Behavior: Behavior normal.      No results found for any visits on 03/29/21.  Assessment & Plan     Problem List Items Addressed This Visit   None   Visit Diagnoses    Plantar fasciitis    -  Primary   Relevant Orders   Uric acid     Plan Will follow up with lab results Continue to follow up with podiatry No changes needed to pain regimen Discussed conservative treatment options  Return in about 6 months (around 09/28/2021).      Henning, Jay [phone] (phone) [phone] (fax)  [place]

## 2021-03-30 LAB — URIC ACID: Uric Acid: 5.5 mg/dL (ref 3.8–8.4)

## 2021-04-04 ENCOUNTER — Other Ambulatory Visit: Payer: Self-pay

## 2021-04-04 MED FILL — Meloxicam Tab 15 MG: ORAL | 30 days supply | Qty: 30 | Fill #0 | Status: AC

## 2021-04-07 ENCOUNTER — Other Ambulatory Visit: Payer: Self-pay

## 2021-04-07 ENCOUNTER — Encounter: Payer: Self-pay | Admitting: Podiatry

## 2021-04-07 ENCOUNTER — Ambulatory Visit (INDEPENDENT_AMBULATORY_CARE_PROVIDER_SITE_OTHER): Payer: 59 | Admitting: Podiatry

## 2021-04-07 DIAGNOSIS — M722 Plantar fascial fibromatosis: Secondary | ICD-10-CM | POA: Diagnosis not present

## 2021-04-07 DIAGNOSIS — M659 Synovitis and tenosynovitis, unspecified: Secondary | ICD-10-CM

## 2021-04-20 DIAGNOSIS — M9906 Segmental and somatic dysfunction of lower extremity: Secondary | ICD-10-CM | POA: Diagnosis not present

## 2021-04-20 DIAGNOSIS — M722 Plantar fascial fibromatosis: Secondary | ICD-10-CM | POA: Diagnosis not present

## 2021-04-21 DIAGNOSIS — M722 Plantar fascial fibromatosis: Secondary | ICD-10-CM | POA: Diagnosis not present

## 2021-04-21 DIAGNOSIS — M9906 Segmental and somatic dysfunction of lower extremity: Secondary | ICD-10-CM | POA: Diagnosis not present

## 2021-04-23 DIAGNOSIS — M659 Synovitis and tenosynovitis, unspecified: Secondary | ICD-10-CM | POA: Diagnosis not present

## 2021-04-23 DIAGNOSIS — M722 Plantar fascial fibromatosis: Secondary | ICD-10-CM

## 2021-04-23 MED ORDER — BETAMETHASONE SOD PHOS & ACET 6 (3-3) MG/ML IJ SUSP
3.0000 mg | Freq: Once | INTRAMUSCULAR | Status: AC
  Administered 2021-04-23: 3 mg via INTRA_ARTICULAR

## 2021-04-23 NOTE — Progress Notes (Signed)
   Subjective: 50 y.o. male established patient presenting today for continued left ankle pain and plantar fasciitis to the right.  He continues to have pain and tenderness associated to both.  He has been taking the meloxicam as instructed.  He presents for further treatment evaluation  Past Medical History:  Diagnosis Date  . Allergy   . Arthritis    knees  . Essential hypertension, benign   . GERD (gastroesophageal reflux disease)   . Near syncope 04/24/2012  . Other and unspecified hyperlipidemia   . Other testicular hypofunction   . PVC (premature ventricular contraction) 11/06/2013  . Sleep apnea    CPAP  . Unspecified essential hypertension      Objective: Physical Exam General: The patient is alert and oriented x3 in no acute distress.  Dermatology: Skin is warm, dry and supple bilateral lower extremities. Negative for open lesions or macerations bilateral.   Vascular: Dorsalis Pedis and Posterior Tibial pulses palpable bilateral.  Capillary fill time is immediate to all digits.  Neurological: Epicritic and protective threshold intact bilateral.   Musculoskeletal: Tenderness to palpation to the plantar aspect of the right heel along the plantar fascia with a palpable lump. Pain to palpation also noted to the medial aspect of the left ankle joint. All other joints range of motion within normal limits bilateral. Strength 5/5 in all groups bilateral.   Radiographic exam: Normal osseous mineralization. Joint spaces preserved. No fracture/dislocation/boney destruction. No other soft tissue abnormalities or radiopaque foreign bodies.   Assessment: 1. Plantar fasciitis/possible plantar fascial rupture right 2. Ankle capsulitis medial aspect left  Plan of Care:  1. Patient evaluated. Xrays reviewed.   2. Injection of 0.5cc Celestone soluspan injected into the right plantar fascia  3.  Today we are going to order MRI left ankle without contrast to further evaluate the  patient's chronic ankle pain.  He has failed conservative treatments and continues to have pain to the left ankle 4.  Continue meloxicam 15 mg daily 5.  Continue ankle brace left.  Compression ankle sleeve right. 6. Continue [employer] boost running shoes. 7. Return to clinic after MRI to review results.  Patient may likely need endoscopic plantar fasciotomy right.  Ankle arthroscopy left.  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 Daughter is [place] this weekend. Wife starting [employer] @ [employer]  Edrick Kins, DPM [facility]  Dr. Edrick Kins, DPM    [address], [place] [address]                Office [phone]  Fax [phone]

## 2021-04-28 ENCOUNTER — Other Ambulatory Visit: Payer: Self-pay | Admitting: Family Medicine

## 2021-04-28 ENCOUNTER — Other Ambulatory Visit: Payer: Self-pay

## 2021-04-28 MED FILL — Meloxicam Tab 15 MG: ORAL | 30 days supply | Qty: 30 | Fill #1 | Status: AC

## 2021-05-01 ENCOUNTER — Other Ambulatory Visit: Payer: Self-pay

## 2021-05-02 ENCOUNTER — Other Ambulatory Visit: Payer: Self-pay

## 2021-05-04 ENCOUNTER — Other Ambulatory Visit: Payer: Self-pay

## 2021-05-04 DIAGNOSIS — M79671 Pain in right foot: Secondary | ICD-10-CM | POA: Diagnosis not present

## 2021-05-04 DIAGNOSIS — M722 Plantar fascial fibromatosis: Secondary | ICD-10-CM | POA: Diagnosis not present

## 2021-05-04 DIAGNOSIS — M25572 Pain in left ankle and joints of left foot: Secondary | ICD-10-CM | POA: Diagnosis not present

## 2021-05-04 DIAGNOSIS — M7731 Calcaneal spur, right foot: Secondary | ICD-10-CM | POA: Diagnosis not present

## 2021-05-04 MED ORDER — DICLOFENAC SODIUM 75 MG PO TBEC
DELAYED_RELEASE_TABLET | ORAL | 1 refills | Status: AC
  Filled 2021-05-04: qty 60, 30d supply, fill #0
  Filled 2021-06-12: qty 60, 30d supply, fill #1

## 2021-05-08 ENCOUNTER — Telehealth: Payer: Self-pay

## 2021-05-08 NOTE — Telephone Encounter (Signed)
Copied from [place] [id number]. Topic: Quick Communication - Rx Refill/Question >> May 08, 2021  4:16 PM Loma Boston wrote: Medication:fenofibrate 160 MG tablet (Discontinued) 90 tablet 1 10/07/2020 02/21/2021  Sig: TAKE 1 TABLET BY MOUTH DAILY.  Patient taking differently: Take 160 mg by mouth daily.      Sent to pharmacy as: fenofibrate 160 MG tablet  E-Prescribing Status: Receipt confirmed by pharmacy (10/07/2020 10:09 PM EDT)     Has the patient contacted their pharmacy? {yes (Agent: If no, request that the patient contact the pharmacy for the refill.) (Agent: If yes, when and what did the pharmacy advise?) call dr Pt said pharmacy submitted twice and refused. Pls refill or fu with pt at [phone]  Preferred Pharmacy (with phone number or street name): [facility] [place] [address] Phone: [phone] Fax: [phone] Hours: M-F 7:30a-5:30p    Agent: Please be advised that RX refills may take up to 3 business days. We ask that you follow-up with your pharmacy.

## 2021-05-09 ENCOUNTER — Other Ambulatory Visit: Payer: Self-pay

## 2021-05-09 MED FILL — Atorvastatin Calcium Tab 10 MG (Base Equivalent): ORAL | 90 days supply | Qty: 90 | Fill #0 | Status: AC

## 2021-05-09 NOTE — Telephone Encounter (Addendum)
Tried calling patient. Left message to call back. OK for [facility] triage to advise as below. Also advise patient that prescription for Atorvastatin is waiting at the [facility] pharmacy.

## 2021-05-09 NOTE — Telephone Encounter (Signed)
Please advise which medication patient is supposed to be taking. This refill request is for Fenofibrate. It looks like Carles Collet, PA-C, switched patient from Fenofibrate to Atorvastatin during  office visit on 02/21/2021.

## 2021-05-09 NOTE — Telephone Encounter (Signed)
Pt. Given message, verbalizes understanding.

## 2021-05-09 NOTE — Telephone Encounter (Signed)
Fenofibrate was supposed to have been discontinued, is only supposed to be taking atorvastatin.

## 2021-05-12 ENCOUNTER — Ambulatory Visit: Payer: 59 | Admitting: Family Medicine

## 2021-05-24 ENCOUNTER — Other Ambulatory Visit: Payer: Self-pay

## 2021-05-24 DIAGNOSIS — M722 Plantar fascial fibromatosis: Secondary | ICD-10-CM | POA: Diagnosis not present

## 2021-05-24 DIAGNOSIS — M7731 Calcaneal spur, right foot: Secondary | ICD-10-CM | POA: Diagnosis not present

## 2021-05-24 DIAGNOSIS — M7671 Peroneal tendinitis, right leg: Secondary | ICD-10-CM | POA: Diagnosis not present

## 2021-05-24 DIAGNOSIS — M76821 Posterior tibial tendinitis, right leg: Secondary | ICD-10-CM | POA: Diagnosis not present

## 2021-05-24 MED ORDER — PREDNISONE 10 MG PO TABS
ORAL_TABLET | ORAL | 0 refills | Status: DC
  Filled 2021-05-24: qty 48, 12d supply, fill #0

## 2021-05-24 MED FILL — Lisinopril & Hydrochlorothiazide Tab 20-12.5 MG: ORAL | 90 days supply | Qty: 90 | Fill #0 | Status: AC

## 2021-06-01 ENCOUNTER — Other Ambulatory Visit: Payer: Self-pay

## 2021-06-01 ENCOUNTER — Telehealth: Payer: Self-pay | Admitting: Gastroenterology

## 2021-06-01 DIAGNOSIS — Z1211 Encounter for screening for malignant neoplasm of colon: Secondary | ICD-10-CM

## 2021-06-01 NOTE — Telephone Encounter (Signed)
LVM for pt to return my call to schedule colonoscopy.

## 2021-06-01 NOTE — Telephone Encounter (Signed)
Patient ready to schedule proce3dure, please  call

## 2021-06-01 NOTE — Telephone Encounter (Signed)
Pt scheduled for a screening colonoscopy with Wohl at [facility] on 07/31/21.

## 2021-06-12 ENCOUNTER — Other Ambulatory Visit: Payer: Self-pay

## 2021-06-23 ENCOUNTER — Other Ambulatory Visit: Payer: Self-pay

## 2021-06-23 ENCOUNTER — Ambulatory Visit (INDEPENDENT_AMBULATORY_CARE_PROVIDER_SITE_OTHER): Payer: 59 | Admitting: Family Medicine

## 2021-06-23 ENCOUNTER — Encounter: Payer: Self-pay | Admitting: Family Medicine

## 2021-06-23 VITALS — BP 145/80 | HR 61 | Temp 98.1°F | Resp 16 | Ht 73.0 in | Wt 339.0 lb

## 2021-06-23 DIAGNOSIS — I10 Essential (primary) hypertension: Secondary | ICD-10-CM | POA: Diagnosis not present

## 2021-06-23 DIAGNOSIS — E781 Pure hyperglyceridemia: Secondary | ICD-10-CM

## 2021-06-23 DIAGNOSIS — H60311 Diffuse otitis externa, right ear: Secondary | ICD-10-CM | POA: Diagnosis not present

## 2021-06-23 MED ORDER — CIPROFLOXACIN-DEXAMETHASONE 0.3-0.1 % OT SUSP
4.0000 [drp] | Freq: Two times a day (BID) | OTIC | 0 refills | Status: AC
  Filled 2021-06-23: qty 7.5, 14d supply, fill #0

## 2021-06-23 NOTE — Assessment & Plan Note (Signed)
Discussed importance of healthy weight management Discussed diet and exercise  

## 2021-06-23 NOTE — Progress Notes (Signed)
Established patient visit   Patient: Devon Miranda   DOB: 07/24/1971   50 y.o. Male  MRN: [id number] Visit Date: 06/23/2021  Today's healthcare provider: Lavon Paganini, MD   Chief Complaint  Patient presents with   Hypertension   Hyperlipidemia   Ear Pain   Subjective    Otalgia  There is pain in the right ear. This is a new problem. The current episode started in the past 7 days. The problem occurs every few minutes. The problem has been gradually improving. There has been no fever. The pain is mild. Pertinent negatives include no abdominal pain, coughing, diarrhea, ear discharge, headaches, rhinorrhea, sore throat or vomiting. He has tried nothing for the symptoms.  Hypertension Pertinent negatives include no chest pain, headaches, palpitations or shortness of breath.  Hyperlipidemia Pertinent negatives include no chest pain or shortness of breath.   Hypertension, follow-up  BP Readings from Last 3 Encounters:  06/23/21 (!) 145/80  03/29/21 (!) 150/89  02/21/21 135/81   Wt Readings from Last 3 Encounters:  06/23/21 (!) 339 lb (153.8 kg)  03/29/21 (!) 341 lb (154.7 kg)  02/21/21 (!) 352 lb 14.4 oz (160.1 kg)     He was last seen for hypertension 4 months ago.  BP at that visit was 135/81. Management since that visit includes no changes.  He reports excellent compliance with treatment. He is not having side effects.  He is following a Regular diet. He is not exercising. He does not smoke.  Use of agents associated with hypertension: none.   Outside blood pressures are elevated some times. Symptoms: No chest pain No chest pressure  No palpitations No syncope  No dyspnea No orthopnea  No paroxysmal nocturnal dyspnea Yes lower extremity edema   Pertinent labs: Lab Results  Component Value Date   CHOL 193 06/24/2020   HDL 45 06/24/2020   LDLCALC 120 (H) 06/24/2020   LDLDIRECT 108.0 12/04/2018   TRIG 158 (H) 06/24/2020   CHOLHDL 4.3 06/24/2020    Lab Results  Component Value Date   NA 137 11/25/2020   K 3.8 11/25/2020   CREATININE 0.75 11/25/2020   GFRNONAA >60 11/25/2020   GFRAA 117 06/24/2020   GLUCOSE 105 (H) 11/25/2020     The 10-year ASCVD risk score Mikey Bussing DC Jr., et al., 2013) is: 5.4%   --------------------------------------------------------------------------------------------------- Lipid/Cholesterol, Follow-up  Last lipid panel Other pertinent labs  Lab Results  Component Value Date   CHOL 193 06/24/2020   HDL 45 06/24/2020   LDLCALC 120 (H) 06/24/2020   LDLDIRECT 108.0 12/04/2018   TRIG 158 (H) 06/24/2020   CHOLHDL 4.3 06/24/2020   Lab Results  Component Value Date   ALT 15 06/24/2020   AST 15 06/24/2020   PLT 254 06/24/2020   TSH 2.040 06/24/2020     He was last seen for this 4 months ago.  Management since that visit includes switch fenofibrate to Lipitor 10mg  nightly.  He reports excellent compliance with treatment. He is not having side effects.   Symptoms: No chest pain No chest pressure/discomfort  No dyspnea Yes lower extremity edema  No numbness or tingling of extremity No orthopnea  No palpitations No paroxysmal nocturnal dyspnea  No speech difficulty No syncope   Current diet: in general, an "unhealthy" diet Current exercise: none  The 10-year ASCVD risk score Mikey Bussing DC Brooke Bonito., et al., 2013) is: 5.4%  ---------------------------------------------------------------------------------------------------   Patient Active Problem List   Diagnosis Date Noted   Morbid obesity ([place])  08/05/2019   Musculoskeletal chest pain 06/01/2019   Sinusitis 06/01/2019   Allergic rhinitis 06/01/2019   Acid reflux 12/24/2018   Chronic low back pain 11/28/2018   Diverticulitis 11/19/2018   Chronic pain of right knee 08/06/2018   Encounter for general adult medical examination with abnormal findings 06/06/2018   Scrotal pain 10/29/2017   Pain in right testicle 10/23/2017   OSA (obstructive sleep apnea)  10/23/2017   Lightheadedness 10/07/2017   Decreased hearing of left ear 10/07/2017   Hypertriglyceridemia 10/07/2017   History of hypoglycemia 10/07/2017   PVC (premature ventricular contraction) 11/06/2013   Anxiety 11/06/2013   Bradycardia 09/21/2013   Dyspnea 04/24/2012   Near syncope 04/24/2012   Hypertension 04/24/2012   Social History   Socioeconomic History   Marital status: Married    Spouse name: Not on file   Number of children: Not on file   Years of education: Not on file   Highest education level: Not on file  Occupational History   Not on file  Tobacco Use   Smoking status: Never   Smokeless tobacco: Never  Vaping Use   Vaping Use: Never used  Substance and Sexual Activity   Alcohol use: Yes    Comment: occassionally - 1x/mo   Drug use: No   Sexual activity: Not on file  Other Topics Concern   Not on file  Social History Narrative   Not on file   Social Determinants of Health   Financial Resource Strain: Not on file  Food Insecurity: Not on file  Transportation Needs: Not on file  Physical Activity: Not on file  Stress: Not on file  Social Connections: Not on file  Intimate Partner Violence: Not on file   No Known Allergies     Medications: Outpatient Medications Prior to Visit  Medication Sig   Albuterol Sulfate (PROAIR RESPICLICK) 284 (90 Base) MCG/ACT AEPB Inhale 1-2 puffs into the lungs every 6 (six) hours as needed.   ascorbic acid (VITAMIN C) 500 MG tablet Take 1,500 mg by mouth 2 (two) times daily.   atorvastatin (LIPITOR) 10 MG tablet TAKE 1 TABLET BY MOUTH DAILY.   Coenzyme Q10 (CO Q 10) 100 MG CAPS Take 100 mg by mouth daily.    diclofenac (VOLTAREN) 75 MG EC tablet Take 1 tablet (75 mg total) by mouth 2 (two) times daily with meals   fluticasone (FLONASE) 50 MCG/ACT nasal spray USE 2 SPRAYS IN EACH NOSTRIL ONCE DAILY   Glucosamine HCl 1500 MG TABS Take 3,000 mg by mouth daily.   lisinopril-hydrochlorothiazide (ZESTORETIC) 20-12.5 MG  tablet TAKE 1 TABLET BY MOUTH DAILY.   Magnesium 400 MG TABS Take 400 mg by mouth daily.   Multiple Vitamin (MULTIVITAMIN) capsule Take 1 capsule daily by mouth.   naproxen sodium (ALEVE) 220 MG tablet Take 440 mg by mouth daily as needed (pain).   Omega-3 Fatty Acids (FISH OIL) 1200 MG CPDR Take 3,600 mg by mouth daily. Takes 4 tablets daily.   [DISCONTINUED] meloxicam (MOBIC) 15 MG tablet TAKE 1 TABLET BY MOUTH DAILY. (Patient not taking: Reported on 06/23/2021)   [DISCONTINUED] methylPREDNISolone (MEDROL DOSEPAK) 4 MG TBPK tablet TAKE ALL 6 TABLETS BY MOUTH ON DAY 1, THEN DECREASE BY ONE TABLET EACH DAY (6-5-4-3-2-1) (Patient not taking: No sig reported)   [DISCONTINUED] predniSONE (DELTASONE) 10 MG tablet Take 6 tablets by mouth daily for 4 days, 4 tabs daily for 4 days, 2 tabs daily for 4 days, then stop. (Patient not taking: Reported on 06/23/2021)  No facility-administered medications prior to visit.    Review of Systems  Constitutional:  Negative for appetite change, chills, fatigue and fever.  HENT:  Positive for ear pain. Negative for congestion, ear discharge, rhinorrhea, sinus pain and sore throat.   Respiratory:  Negative for cough, chest tightness, shortness of breath and wheezing.   Cardiovascular:  Negative for chest pain, palpitations and leg swelling.  Gastrointestinal:  Negative for abdominal pain, blood in stool, diarrhea, nausea and vomiting.  Genitourinary:  Negative for dysuria, flank pain, frequency and urgency.  Neurological:  Negative for dizziness and headaches.       Objective    BP (!) 145/80 (BP Location: Left Arm, Patient Position: Sitting, Cuff Size: Large)   Pulse 61   Temp 98.1 F (36.7 C) (Oral)   Resp 16   Ht 6\' 1"  (1.854 m)   Wt (!) 339 lb (153.8 kg)   SpO2 98%   BMI 44.73 kg/m  BP Readings from Last 3 Encounters:  06/23/21 (!) 145/80  03/29/21 (!) 150/89  02/21/21 135/81   Wt Readings from Last 3 Encounters:  06/23/21 (!) 339 lb (153.8 kg)   03/29/21 (!) 341 lb (154.7 kg)  02/21/21 (!) 352 lb 14.4 oz (160.1 kg)    Physical Exam Vitals reviewed.  Constitutional:      General: He is not in acute distress.    Appearance: Normal appearance. He is not diaphoretic.  HENT:     Head: Normocephalic and atraumatic.     Right Ear: Tympanic membrane normal.     Left Ear: Tympanic membrane normal.     Ears:     Comments: Erythematous canal  Eyes:     General: No scleral icterus.    Conjunctiva/sclera: Conjunctivae normal.  Cardiovascular:     Rate and Rhythm: Normal rate and regular rhythm.     Pulses: Normal pulses.     Heart sounds: Normal heart sounds. No murmur heard. Pulmonary:     Effort: Pulmonary effort is normal. No respiratory distress.     Breath sounds: Normal breath sounds. No wheezing or rhonchi.  Abdominal:     General: There is no distension.     Palpations: Abdomen is soft.     Tenderness: There is no abdominal tenderness.  Musculoskeletal:     Cervical back: Neck supple.     Right lower leg: No edema.     Left lower leg: No edema.  Lymphadenopathy:     Cervical: No cervical adenopathy.  Skin:    General: Skin is warm and dry.     Capillary Refill: Capillary refill takes less than 2 seconds.     Findings: No rash.  Neurological:     Mental Status: He is alert and oriented to person, place, and time.     Cranial Nerves: No cranial nerve deficit.  Psychiatric:        Mood and Affect: Mood normal.        Behavior: Behavior normal.      No results found for any visits on 06/23/21.  Assessment & Plan     Problem List Items Addressed This Visit       Cardiovascular and Mediastinum   Hypertension - Primary    Slightly elevated today but well controlled on home readings Continue current meds Monitor home BPs Recheck metabolic panel       Relevant Orders   Comprehensive metabolic panel     Other   Hypertriglyceridemia    Previously well controlled Continue statin  Repeat FLP and CMP        Relevant Orders   Comprehensive metabolic panel   Lipid panel   Morbid obesity ([place])    Discussed importance of healthy weight management Discussed diet and exercise        Other Visit Diagnoses     Acute diffuse otitis externa of right ear         - new probolem with no AOM - starting to improve per patient, so may not need treatment, but will send Ciprodex to pharmacy to use if it worsens over the weekend  Meds ordered this encounter  Medications   ciprofloxacin-dexamethasone (CIPRODEX) OTIC suspension    Sig: Place 4 drops into the right ear 2 (two) times daily.    Dispense:  7.5 mL    Refill:  0      Return in about 6 months (around 12/24/2021) for chronic disease f/u, call back in 1 month to schedule with new PCP.      Frederic Jericho [place],acting as a Education administrator for Lavon Paganini, MD.,have documented all relevant documentation on the behalf of Lavon Paganini, MD,as directed by  Lavon Paganini, MD while in the presence of Lavon Paganini, MD.  I, Lavon Paganini, MD, have reviewed all documentation for this visit. The documentation on 06/23/21 for the exam, diagnosis, procedures, and orders are all accurate and complete.   Nickholas Goldston, Dionne Bucy, MD, MPH [facility]

## 2021-06-23 NOTE — Patient Instructions (Signed)

## 2021-06-23 NOTE — Assessment & Plan Note (Signed)
Previously well controlled Continue statin Repeat FLP and CMP  

## 2021-06-23 NOTE — Assessment & Plan Note (Signed)
Slightly elevated today but well controlled on home readings Continue current meds Monitor home BPs Recheck metabolic panel

## 2021-06-24 LAB — COMPREHENSIVE METABOLIC PANEL
ALT: 12 IU/L (ref 0–44)
AST: 12 IU/L (ref 0–40)
Albumin/Globulin Ratio: 1.5 (ref 1.2–2.2)
Albumin: 4.3 g/dL (ref 4.0–5.0)
Alkaline Phosphatase: 87 IU/L (ref 44–121)
BUN/Creatinine Ratio: 16 (ref 9–20)
BUN: 14 mg/dL (ref 6–24)
Bilirubin Total: 0.3 mg/dL (ref 0.0–1.2)
CO2: 24 mmol/L (ref 20–29)
Calcium: 10 mg/dL (ref 8.7–10.2)
Chloride: 102 mmol/L (ref 96–106)
Creatinine, Ser: 0.85 mg/dL (ref 0.76–1.27)
Globulin, Total: 2.8 g/dL (ref 1.5–4.5)
Glucose: 98 mg/dL (ref 65–99)
Potassium: 4.3 mmol/L (ref 3.5–5.2)
Sodium: 143 mmol/L (ref 134–144)
Total Protein: 7.1 g/dL (ref 6.0–8.5)
eGFR: 106 mL/min/{1.73_m2} (ref >59)

## 2021-06-24 LAB — LIPID PANEL
Chol/HDL Ratio: 4.3 ratio (ref 0.0–5.0)
Cholesterol, Total: 154 mg/dL (ref 100–199)
HDL: 36 mg/dL — ABNORMAL LOW (ref >39)
LDL Chol Calc (NIH): 82 mg/dL (ref 0–99)
Triglycerides: 214 mg/dL — ABNORMAL HIGH (ref 0–149)
VLDL Cholesterol Cal: 36 mg/dL (ref 5–40)

## 2021-06-26 ENCOUNTER — Telehealth: Payer: Self-pay | Admitting: Family Medicine

## 2021-06-26 ENCOUNTER — Other Ambulatory Visit: Payer: Self-pay

## 2021-06-26 NOTE — Telephone Encounter (Signed)
Copied from [place] [id number]. Topic: Quick Communication - Lab Results (Clinic Use ONLY) >> Jun 26, 2021  9:38 AM Doristine Devoid, CMA wrote: Called patient to inform them of lab results. When patient returns call, triage nurse may disclose results.     Pt returning call. Please advise.

## 2021-06-26 NOTE — Telephone Encounter (Signed)
Documented in result notes. 

## 2021-06-26 NOTE — Telephone Encounter (Signed)
Devon Miranda, from Pharmacy, called stating that the pts insurance is not able to cover the ciprodex ear drops. She is requesting to have another medication sent in for pt that is covered. Please advise.    Devon  [address] [place] [address]  Phone: [phone] Fax: [phone]  Hours: M-F 7:30a-5:30p

## 2021-06-27 NOTE — Telephone Encounter (Signed)
Called and spoke with patient he reports that he still has soreness but it does seem to be improving. Patient states that he will hold of for now on prescription for drops and if symptoms do not resolve he will call our office back. KW

## 2021-06-27 NOTE — Telephone Encounter (Signed)
Noted  

## 2021-06-27 NOTE — Telephone Encounter (Signed)
Before we send in another Rx, can we check with him and see if he even ended up needing drops? He was getting better when I saw him Friday

## 2021-06-28 ENCOUNTER — Encounter: Payer: Self-pay | Admitting: Physician Assistant

## 2021-07-03 ENCOUNTER — Other Ambulatory Visit: Payer: Self-pay

## 2021-07-03 ENCOUNTER — Telehealth: Payer: Self-pay | Admitting: Gastroenterology

## 2021-07-03 MED ORDER — SUPREP BOWEL PREP KIT 17.5-3.13-1.6 GM/177ML PO SOLN
1.0000 | ORAL | 0 refills | Status: AC
  Filled 2021-07-03: qty 354, 1d supply, fill #0

## 2021-07-03 NOTE — Telephone Encounter (Signed)
Wife stated that patient needs a new bowel prep sent to pharmacy, the one he has is expired.Clinical staff will follow up with patient.

## 2021-07-03 NOTE — Telephone Encounter (Signed)
Returned pt's wife call regarding resending his bowel prep to [facility] outpatient pharmacy due to expired prep. Prep has been called in.

## 2021-07-25 ENCOUNTER — Encounter: Payer: Self-pay | Admitting: Family Medicine

## 2021-07-26 ENCOUNTER — Encounter: Payer: Self-pay | Admitting: Gastroenterology

## 2021-07-26 ENCOUNTER — Other Ambulatory Visit: Payer: Self-pay

## 2021-07-26 MED FILL — Lisinopril & Hydrochlorothiazide Tab 20-12.5 MG: ORAL | 90 days supply | Qty: 90 | Fill #1 | Status: CN

## 2021-07-26 MED FILL — Atorvastatin Calcium Tab 10 MG (Base Equivalent): ORAL | 90 days supply | Qty: 90 | Fill #1 | Status: AC

## 2021-07-31 ENCOUNTER — Encounter
Admission: RE | Disposition: A | Discharge status: Home / Self Care | Payer: Self-pay | Source: Home / Self Care | Attending: Gastroenterology

## 2021-07-31 ENCOUNTER — Ambulatory Visit: Payer: 59 | Admitting: Anesthesiology

## 2021-07-31 ENCOUNTER — Other Ambulatory Visit: Payer: Self-pay

## 2021-07-31 ENCOUNTER — Ambulatory Visit
Admission: RE | Admit: 2021-07-31 | Discharge: 2021-07-31 | Disposition: A | Discharge status: Home / Self Care | Payer: 59 | Attending: Gastroenterology | Admitting: Gastroenterology

## 2021-07-31 ENCOUNTER — Encounter: Payer: Self-pay | Admitting: Gastroenterology

## 2021-07-31 DIAGNOSIS — Z1211 Encounter for screening for malignant neoplasm of colon: Secondary | ICD-10-CM

## 2021-07-31 DIAGNOSIS — Z791 Long term (current) use of non-steroidal anti-inflammatories (NSAID): Secondary | ICD-10-CM | POA: Insufficient documentation

## 2021-07-31 DIAGNOSIS — K633 Ulcer of intestine: Secondary | ICD-10-CM | POA: Diagnosis not present

## 2021-07-31 DIAGNOSIS — K529 Noninfective gastroenteritis and colitis, unspecified: Secondary | ICD-10-CM | POA: Diagnosis not present

## 2021-07-31 DIAGNOSIS — Z79899 Other long term (current) drug therapy: Secondary | ICD-10-CM | POA: Insufficient documentation

## 2021-07-31 HISTORY — PX: COLONOSCOPY WITH PROPOFOL: SHX5780

## 2021-07-31 HISTORY — DX: Cardiac arrhythmia, unspecified: I49.9

## 2021-07-31 SURGERY — COLONOSCOPY WITH PROPOFOL
Anesthesia: General | Site: Rectum

## 2021-07-31 MED ORDER — STERILE WATER FOR IRRIGATION IR SOLN: Status: DC | PRN

## 2021-07-31 MED ORDER — ONDANSETRON HCL 4 MG/2ML IJ SOLN: 4.0000 mg | Freq: Once | INTRAMUSCULAR | Status: DC | PRN

## 2021-07-31 MED ORDER — PROPOFOL 10 MG/ML IV BOLUS
INTRAVENOUS | Status: DC | PRN
  Administered 2021-07-31: 100 mg via INTRAVENOUS
  Administered 2021-07-31 (×2): 30 mg via INTRAVENOUS
  Administered 2021-07-31 (×3): 40 mg via INTRAVENOUS
  Administered 2021-07-31 (×2): 30 mg via INTRAVENOUS
  Administered 2021-07-31: 20 mg via INTRAVENOUS

## 2021-07-31 MED ORDER — ACETAMINOPHEN 160 MG/5ML PO SOLN: 325.0000 mg | ORAL | Status: DC | PRN

## 2021-07-31 MED ORDER — LACTATED RINGERS IV SOLN: INTRAVENOUS | Status: DC

## 2021-07-31 MED ORDER — ACETAMINOPHEN 325 MG PO TABS: 325.0000 mg | ORAL_TABLET | ORAL | Status: DC | PRN

## 2021-07-31 MED ORDER — SODIUM CHLORIDE 0.9 % IV SOLN: INTRAVENOUS | Status: DC

## 2021-07-31 MED ORDER — LIDOCAINE HCL (CARDIAC) PF 100 MG/5ML IV SOSY
PREFILLED_SYRINGE | INTRAVENOUS | Status: DC | PRN
  Administered 2021-07-31: 50 mg via INTRAVENOUS

## 2021-07-31 SURGICAL SUPPLY — 7 items
FORCEPS BIOP RAD 4 LRG CAP 4 (CUTTING FORCEPS) ×2 IMPLANT
GOWN CVR UNV OPN BCK APRN NK (MISCELLANEOUS) ×2 IMPLANT
GOWN ISOL THUMB LOOP REG UNIV (MISCELLANEOUS) ×4
KIT PRC NS LF DISP ENDO (KITS) ×1 IMPLANT
KIT PROCEDURE OLYMPUS (KITS) ×2
MANIFOLD NEPTUNE II (INSTRUMENTS) ×2 IMPLANT
WATER STERILE IRR 250ML POUR (IV SOLUTION) ×2 IMPLANT

## 2021-07-31 NOTE — Op Note (Signed)
[facility] Patient Name: Devon Miranda Procedure Date: 07/31/2021 8:12 AM MRN: [id number] Account #: [id number] Date of Birth: 05-08-1971 Admit Type: Outpatient Age: 50 Room: [facility] OR ROOM 01 Gender: Male Note Status: Finalized Procedure:             Colonoscopy Indications:           Screening for colorectal malignant neoplasm Providers:             Lucilla Lame MD, MD Referring MD:          Dionne Bucy. Bacigalupo (Referring MD) Medicines:             Propofol per Anesthesia Complications:         No immediate complications. Procedure:             Pre-Anesthesia Assessment:                        - Prior to the procedure, a History and Physical was                         performed, and patient medications and allergies were                         reviewed. The patient's tolerance of previous                         anesthesia was also reviewed. The risks and benefits                         of the procedure and the sedation options and risks                         were discussed with the patient. All questions were                         answered, and informed consent was obtained. Prior                         Anticoagulants: The patient has taken no previous                         anticoagulant or antiplatelet agents. ASA Grade                         Assessment: II - A patient with mild systemic disease.                         After reviewing the risks and benefits, the patient                         was deemed in satisfactory condition to undergo the                         procedure.                        After obtaining informed consent, the colonoscope was  passed under direct vision. Throughout the procedure,                         the patient's blood pressure, pulse, and oxygen                         saturations were monitored continuously. The                         Colonoscope was introduced through  the anus and                         advanced to the the cecum, identified by appendiceal                         orifice and ileocecal valve. The colonoscopy was                         performed without difficulty. The patient tolerated                         the procedure well. The quality of the bowel                         preparation was excellent. Findings:      The perianal and digital rectal examinations were normal.      Multiple diffuse non-bleeding erosions were found in the entire colon.       Biopsies were taken with a cold forceps for histology. Impression:            - Multiple erosions in the entire examined colon.                         Biopsied. Recommendation:        - Discharge patient to home.                        - Resume previous diet.                        - Continue present medications.                        - Await pathology results.                        - Repeat colonoscopy in 10 years for screening                         purposes.                        - Avoid NSAID's Procedure Code(s):     --- Professional ---                        [phone], Colonoscopy, flexible; with biopsy, single or                         multiple Diagnosis Code(s):     --- Professional ---  Z12.11, Encounter for screening for malignant neoplasm                         of colon                        K63.3, Ulcer of intestine CPT copyright 2019 American Medical Association. All rights reserved. The codes documented in this report are preliminary and upon coder review may  be revised to meet current compliance requirements. Lucilla Lame MD, MD 07/31/2021 8:35:20 AM This report has been signed electronically. Number of Addenda: 0 Note Initiated On: 07/31/2021 8:12 AM Scope Withdrawal Time: 0 hours 7 minutes 32 seconds  Total Procedure Duration: 0 hours 10 minutes 24 seconds  Estimated Blood Loss:  Estimated blood loss: none.      [facility]

## 2021-07-31 NOTE — Transfer of Care (Signed)
Immediate Anesthesia Transfer of Care Note  Patient: Devon Miranda  Procedure(s) Performed: COLONOSCOPY WITH BIOPSY (Rectum)  Patient Location: PACU  Anesthesia Type: General ETT  Level of Consciousness: awake, alert  and patient cooperative  Airway and Oxygen Therapy: Patient Spontanous Breathing and Patient connected to supplemental oxygen  Post-op Assessment: Post-op Vital signs reviewed, Patient's Cardiovascular Status Stable, Respiratory Function Stable, Patent Airway and No signs of Nausea or vomiting  Post-op Vital Signs: Reviewed and stable  Complications: No notable events documented.

## 2021-07-31 NOTE — Anesthesia Preprocedure Evaluation (Signed)
Anesthesia Evaluation  Patient identified by MRN, date of birth, ID band Patient awake    Reviewed: Allergy & Precautions, H&P , NPO status , Patient's Chart, lab work & pertinent test results  History of Anesthesia Complications Negative for: history of anesthetic complications  Airway Mallampati: III  TM Distance: >3 FB     Dental no notable dental hx. (+) Teeth Intact   Pulmonary shortness of breath and with exertion, sleep apnea , neg COPD,    Pulmonary exam normal breath sounds clear to auscultation       Cardiovascular Exercise Tolerance: Good hypertension, (-) angina(-) Past MI and (-) Cardiac Stents Normal cardiovascular exam+ dysrhythmias (bradycardia)  Rhythm:regular Rate:Normal     Neuro/Psych PSYCHIATRIC DISORDERS Anxiety negative neurological ROS     GI/Hepatic Neg liver ROS, GERD  Controlled,  Endo/Other  Morbid obesity  Renal/GU      Musculoskeletal  (+) Arthritis ,   Abdominal (+) + obese,  Abdomen: soft.    Peds  Hematology negative hematology ROS (+)   Anesthesia Other Findings Past Medical History: No date: Allergy No date: Arthritis     Comment:  knees No date: Essential hypertension, benign No date: GERD (gastroesophageal reflux disease) 04/24/2012: Near syncope No date: Other and unspecified hyperlipidemia No date: Other testicular hypofunction 11/06/2013: PVC (premature ventricular contraction) No date: Sleep apnea     Comment:  CPAP No date: Unspecified essential hypertension  Past Surgical History: 1991: arthroscopic      Comment:  left knee surgery No date: VASECTOMY     Comment:  [facility] [place] No date: WISDOM TOOTH EXTRACTION     Reproductive/Obstetrics negative OB ROS                             Anesthesia Physical  Anesthesia Plan  ASA: III  Anesthesia Plan: General ETT   Post-op Pain Management:    Induction:    PONV Risk Score and Plan: Treatment may vary due to age or medical condition and Propofol infusion  Airway Management Planned: Natural Airway and Nasal Cannula  Additional Equipment:   Intra-op Plan:   Post-operative Plan:   Informed Consent: I have reviewed the patients History and Physical, chart, labs and discussed the procedure including the risks, benefits and alternatives for the proposed anesthesia with the patient or authorized representative who has indicated his/her understanding and acceptance.     Dental Advisory Given  Plan Discussed with: CRNA  Anesthesia Plan Comments:         Anesthesia Quick Evaluation  Patient Active Problem List   Diagnosis Date Noted  . Morbid obesity ([place]) 08/05/2019  . Musculoskeletal chest pain 06/01/2019  . Sinusitis 06/01/2019  . Allergic rhinitis 06/01/2019  . Acid reflux 12/24/2018  . Chronic low back pain 11/28/2018  . Diverticulitis 11/19/2018  . Chronic pain of right knee 08/06/2018  . Encounter for general adult medical examination with abnormal findings 06/06/2018  . Scrotal pain 10/29/2017  . Pain in right testicle 10/23/2017  . OSA (obstructive sleep apnea) 10/23/2017  . Lightheadedness 10/07/2017  . Decreased hearing of left ear 10/07/2017  . Hypertriglyceridemia 10/07/2017  . History of hypoglycemia 10/07/2017  . PVC (premature ventricular contraction) 11/06/2013  . Anxiety 11/06/2013  . Bradycardia 09/21/2013  . Dyspnea 04/24/2012  . Near syncope 04/24/2012  . Hypertension 04/24/2012    CBC Latest Ref Rng & Units 06/24/2020 08/05/2019 11/19/2018  WBC 3.4 - 10.8 x10E3/uL 7.1 6.3  6.9  Hemoglobin 13.0 - 17.7 g/dL 14.3 14.2 13.8  Hematocrit 37.5 - 51.0 % 42.2 40.6 40.9  Platelets 150 - 450 x10E3/uL 254 213 301.0   BMP Latest Ref Rng & Units 06/23/2021 11/25/2020 06/24/2020  Glucose 65 - 99 mg/dL 98 105(H) 86  BUN 6 - 24 mg/dL 14 17 12   Creatinine 0.76 - 1.27 mg/dL 0.85 0.75 0.88  BUN/Creat Ratio 9 - 20 16 -  14  Sodium 134 - 144 mmol/L 143 137 142  Potassium 3.5 - 5.2 mmol/L 4.3 3.8 4.0  Chloride 96 - 106 mmol/L 102 100 101  CO2 20 - 29 mmol/L 24 27 25   Calcium 8.7 - 10.2 mg/dL 10.0 8.9 10.0    Risks and benefits of anesthesia discussed at length, patient or surrogate demonstrates understanding. Appropriately NPO. Plan to proceed with anesthesia.  Champ Mungo, MD 07/31/21

## 2021-07-31 NOTE — H&P (Signed)
Devon Lame, MD [facility] [address], [place], [place] [address] Phone: [phone] Fax : [phone]  Primary Care Physician:  Virginia Crews, MD Primary Gastroenterologist:  Dr. Allen Norris  Pre-Procedure History & Physical: HPI:  Devon Miranda is a 50 y.o. male is here for a screening colonoscopy.   Past Medical History:  Diagnosis Date   Allergy    Arthritis    knees   Essential hypertension, benign    GERD (gastroesophageal reflux disease)    Irregular heart beat    D Caryl Comes said nothing to worry about per patient   Near syncope 04/24/2012   Other and unspecified hyperlipidemia    Other testicular hypofunction    PVC (premature ventricular contraction) 11/06/2013   Sleep apnea    CPAP   Unspecified essential hypertension     Past Surgical History:  Procedure Laterality Date   arthroscopic   1991   left knee surgery   NASAL SEPTOPLASTY W/ TURBINOPLASTY Bilateral 11/29/2020   Procedure: NASAL SEPTOPLASTY WITH SMR TURBINATE REDUCTION;  Surgeon: Beverly Gust, MD;  Location: [facility] ORS;  Service: ENT;  Laterality: Bilateral;   [place]      Prior to Admission medications   Medication Sig Start Date End Date Taking? Authorizing Provider  ascorbic acid (VITAMIN C) 500 MG tablet Take 1,500 mg by mouth 2 (two) times daily.   Yes [provider]  atorvastatin (LIPITOR) 10 MG tablet TAKE 1 TABLET BY MOUTH DAILY. 02/21/21 02/21/22 Yes Trinna Post, PA-C  Cholecalciferol (VITAMIN D3) 1.25 MG (50000 UT) TABS Take by mouth.   Yes [provider]  Coenzyme Q10 (CO Q 10) 100 MG CAPS Take 100 mg by mouth daily.    Yes [provider]  diclofenac (VOLTAREN) 75 MG EC tablet Take 1 tablet (75 mg total) by mouth 2 (two) times daily with meals 05/04/21  Yes   Glucosamine HCl 1500 MG TABS Take 3,000 mg by mouth daily.   Yes [provider]  lisinopril-hydrochlorothiazide  (ZESTORETIC) 20-12.5 MG tablet TAKE 1 TABLET BY MOUTH DAILY. 02/21/21 02/21/22 Yes Trinna Post, PA-C  Magnesium 400 MG TABS Take 400 mg by mouth daily.   Yes [provider]  Multiple Vitamin (MULTIVITAMIN) capsule Take 1 capsule daily by mouth.   Yes [provider]  naproxen sodium (ALEVE) 220 MG tablet Take 440 mg by mouth daily as needed (pain).   Yes [provider]  Omega-3 Fatty Acids (FISH OIL) 1200 MG CPDR Take 3,600 mg by mouth daily. Takes 4 tablets daily.   Yes [provider]  Albuterol Sulfate (PROAIR RESPICLICK) 812 (90 Base) MCG/ACT AEPB Inhale 1-2 puffs into the lungs every 6 (six) hours as needed. Patient taking differently: Inhale 1-2 puffs into the lungs every 6 (six) hours as needed. Very rarely 05/19/18   Laverle Hobby, MD  ciprofloxacin-dexamethasone (CIPRODEX) OTIC suspension Place 4 drops into the right ear 2 (two) times daily. Patient not taking: Reported on 07/26/2021 06/23/21   Virginia Crews, MD  fluticasone [facility] [place]) 50 MCG/ACT nasal spray USE 2 SPRAYS IN [facility] NOSTRIL ONCE DAILY Patient not taking: Reported on 07/26/2021 10/05/20 10/05/21  Beverly Gust, MD  Na Sulfate-K Sulfate-Mg Sulf (SUPREP BOWEL PREP KIT) 17.5-3.13-1.6 GM/177ML SOLN Take 1 kit by mouth as directed. 07/03/21   Devon Lame, MD  fenofibrate 160 MG tablet TAKE 1 TABLET BY MOUTH DAILY. Patient taking differently: Take 160 mg by  mouth daily. 10/07/20 02/21/21  Trinna Post, PA-C    Allergies as of 06/01/2021   (No Known Allergies)    Family History  Problem Relation Age of Onset   Hyperlipidemia Mother    Hypertension Mother    Arthritis Mother    Stroke Father    Hyperlipidemia Father    Hypertension Father    Alcohol abuse Father    Arthritis Father    Colon cancer Maternal Grandfather    Prostate cancer Neg Hx    Bladder Cancer Neg Hx    Kidney cancer Neg Hx     Social History   Socioeconomic History   Marital status: Married     Spouse name: Not on file   Number of children: Not on file   Years of education: Not on file   Highest education level: Not on file  Occupational History   Not on file  Tobacco Use   Smoking status: Never   Smokeless tobacco: Never  Vaping Use   Vaping Use: Never used  Substance and Sexual Activity   Alcohol use: Yes    Comment: occassionally - 1x/mo   Drug use: No   Sexual activity: Not on file  Other Topics Concern   Not on file  Social History Narrative   Not on file   Social Determinants of Health   Financial Resource Strain: Not on file  Food Insecurity: Not on file  Transportation Needs: Not on file  Physical Activity: Not on file  Stress: Not on file  Social Connections: Not on file  Intimate Partner Violence: Not on file    Review of Systems: See HPI, otherwise negative ROS  Physical Exam: BP (!) 146/100   Pulse 66   Temp 97.6 F (36.4 C)   Ht _0  (1.854 m)   Wt (!) 151.5 kg   SpO2 95%   BMI 44.07 kg/m  General:   Alert,  pleasant and cooperative in NAD Head:  Normocephalic and atraumatic. Neck:  Supple; no masses or thyromegaly. Lungs:  Clear throughout to auscultation.    Heart:  Regular rate and rhythm. Abdomen:  Soft, nontender and nondistended. Normal bowel sounds, without guarding, and without rebound.   Neurologic:  Alert and  oriented x4;  grossly normal neurologically.  Impression/Plan: Devon Miranda is now here to undergo a screening colonoscopy.  Risks, benefits, and alternatives regarding colonoscopy have been reviewed with the patient.  Questions have been answered.  All parties agreeable.

## 2021-07-31 NOTE — Anesthesia Postprocedure Evaluation (Signed)
Anesthesia Post Note  Patient: Devon Miranda  Procedure(s) Performed: COLONOSCOPY WITH BIOPSY (Rectum)     Patient location during evaluation: PACU Anesthesia Type: General Level of consciousness: awake and alert Pain management: pain level controlled Vital Signs Assessment: post-procedure vital signs reviewed and stable Respiratory status: spontaneous breathing, nonlabored ventilation, respiratory function stable and patient connected to nasal cannula oxygen Cardiovascular status: blood pressure returned to baseline and stable Postop Assessment: no apparent nausea or vomiting Anesthetic complications: no   No notable events documented.  Sinda Du

## 2021-07-31 NOTE — Anesthesia Procedure Notes (Signed)
Date/Time: 07/31/2021 8:19 AM Performed by: Mayme Genta, CRNA Pre-anesthesia Checklist: Patient identified, Emergency Drugs available, Suction available, Timeout performed and Patient being monitored Patient Re-evaluated:Patient Re-evaluated prior to induction Oxygen Delivery Method: Nasal cannula Placement Confirmation: positive ETCO2

## 2021-08-01 ENCOUNTER — Encounter: Payer: Self-pay | Admitting: Gastroenterology

## 2021-08-01 ENCOUNTER — Other Ambulatory Visit: Payer: Self-pay

## 2021-08-01 LAB — SURGICAL PATHOLOGY

## 2021-08-01 MED FILL — Lisinopril & Hydrochlorothiazide Tab 20-12.5 MG: ORAL | 90 days supply | Qty: 90 | Fill #1 | Status: AC

## 2021-08-02 DIAGNOSIS — G4733 Obstructive sleep apnea (adult) (pediatric): Secondary | ICD-10-CM | POA: Diagnosis not present

## 2021-08-03 ENCOUNTER — Other Ambulatory Visit: Payer: Self-pay

## 2021-08-04 ENCOUNTER — Other Ambulatory Visit: Payer: Self-pay

## 2021-08-08 ENCOUNTER — Other Ambulatory Visit: Payer: Self-pay

## 2021-08-09 ENCOUNTER — Other Ambulatory Visit: Payer: Self-pay

## 2021-08-18 ENCOUNTER — Other Ambulatory Visit: Payer: Self-pay

## 2021-09-04 ENCOUNTER — Other Ambulatory Visit: Payer: Self-pay

## 2021-09-15 DIAGNOSIS — M79671 Pain in right foot: Secondary | ICD-10-CM | POA: Diagnosis not present

## 2021-09-15 DIAGNOSIS — M79672 Pain in left foot: Secondary | ICD-10-CM | POA: Diagnosis not present

## 2021-09-15 DIAGNOSIS — M84361A Stress fracture, right tibia, initial encounter for fracture: Secondary | ICD-10-CM | POA: Diagnosis not present

## 2021-09-21 ENCOUNTER — Other Ambulatory Visit: Payer: Self-pay

## 2021-09-21 DIAGNOSIS — K219 Gastro-esophageal reflux disease without esophagitis: Secondary | ICD-10-CM | POA: Diagnosis not present

## 2021-09-21 DIAGNOSIS — R070 Pain in throat: Secondary | ICD-10-CM | POA: Diagnosis not present

## 2021-09-21 MED ORDER — OMEPRAZOLE 40 MG PO CPDR
DELAYED_RELEASE_CAPSULE | ORAL | 10 refills | Status: AC
  Filled 2021-09-21: qty 60, 60d supply, fill #0

## 2021-09-22 ENCOUNTER — Other Ambulatory Visit: Payer: Self-pay

## 2021-09-29 ENCOUNTER — Other Ambulatory Visit: Payer: Self-pay

## 2021-10-10 ENCOUNTER — Other Ambulatory Visit: Payer: Self-pay

## 2021-10-10 DIAGNOSIS — M76821 Posterior tibial tendinitis, right leg: Secondary | ICD-10-CM | POA: Diagnosis not present

## 2021-10-10 DIAGNOSIS — M79661 Pain in right lower leg: Secondary | ICD-10-CM | POA: Diagnosis not present

## 2021-10-10 MED ORDER — METHYLPREDNISOLONE 4 MG PO TBPK
ORAL_TABLET | ORAL | 0 refills | Status: AC
  Filled 2021-10-10: qty 21, 6d supply, fill #0

## 2021-11-11 IMAGING — MR MR CERVICAL SPINE W/O CM
5 series · 39 of 48 positions shown · non-contrast
Comparison: None.

CLINICAL DATA: Neck stiffness

EXAM:
MRI CERVICAL SPINE WITHOUT CONTRAST
TECHNIQUE: Multiplanar, multisequence MR imaging of the cervical spine was
performed. No intravenous contrast was administered.

[Series 5: T2 · sagittal · 3.0mm · 0.62mm/px · 6 of 15 slices shown (1 of 2)]
[im 1/15]
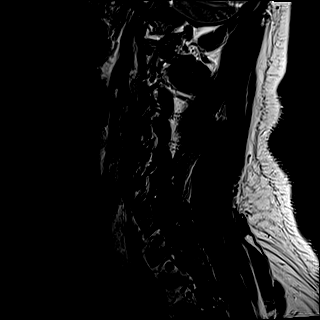
[im 3/15]
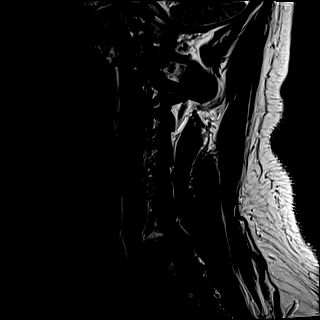
[im 6/15]
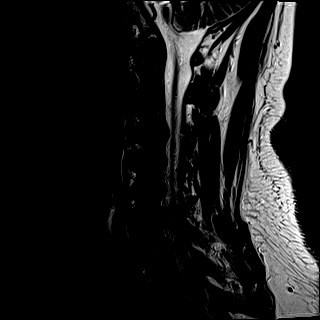
[im 9/15]
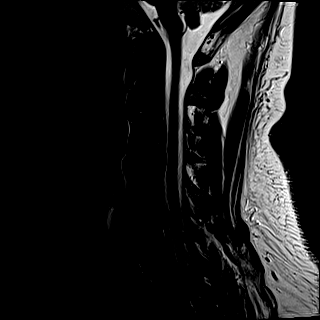
[im 12/15]
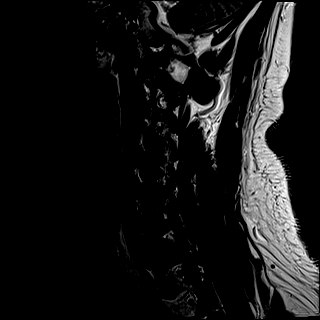
[im 15/15]
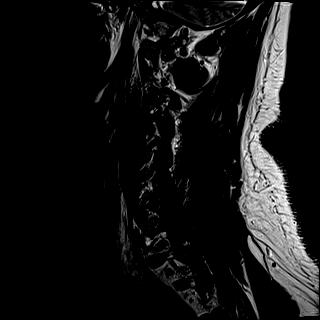

[Series 6: FLAIR · sagittal · 3.0mm · 0.78mm/px · 7 of 15 slices shown]
[im 1/15]
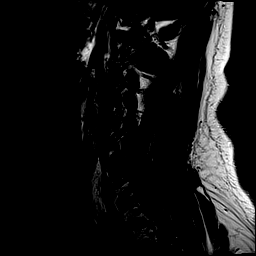
[im 3/15]
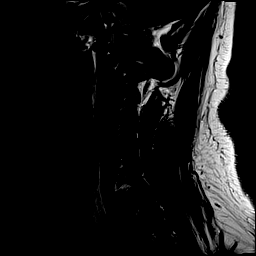
[im 5/15]
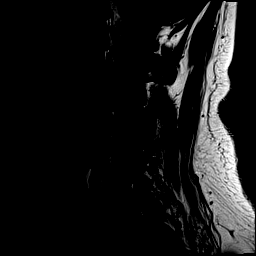
[im 8/15]
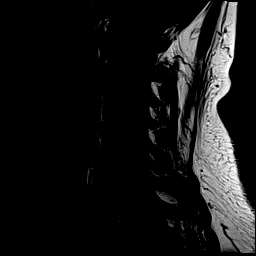
[im 10/15]
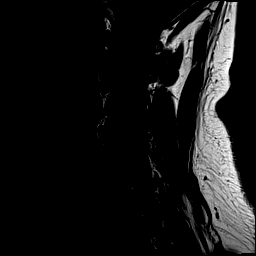
[im 12/15]
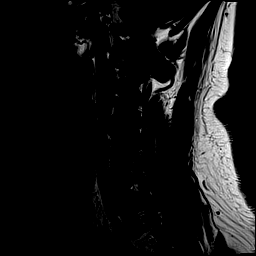
[im 15/15]
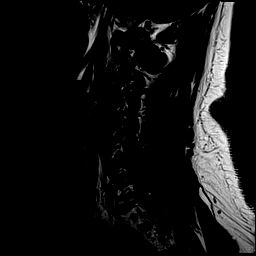

[Series 7: STIR · sagittal · 3.0mm · 0.62mm/px · 7 of 15 slices shown]
[im 1/15]
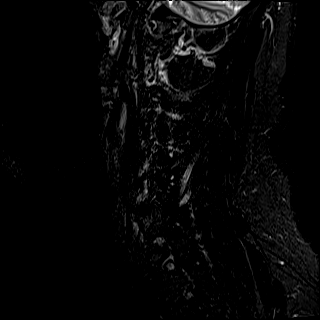
[im 3/15]
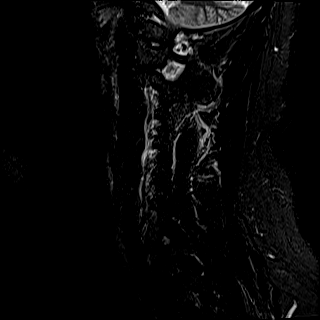
[im 5/15]
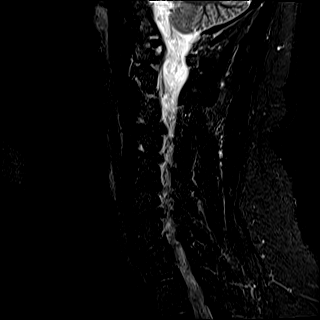
[im 8/15]
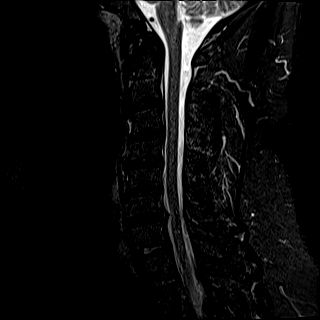
[im 10/15]
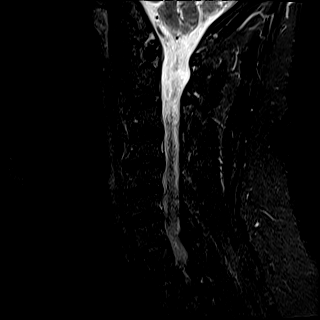
[im 12/15]
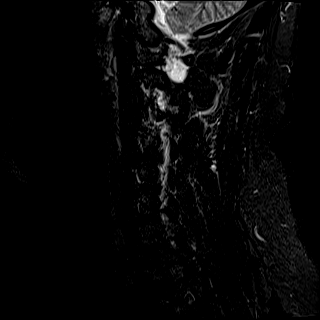
[im 15/15]
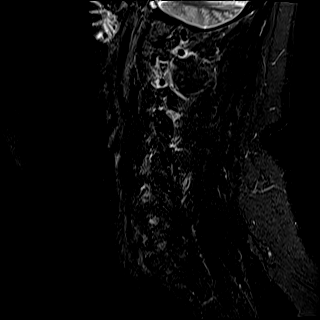

[Series 8: T2 · axial · 3.0mm · 0.70mm/px · z∈[-104,+0]mm · 11 of 31 slices shown (2 of 2)]
[im 1/31]
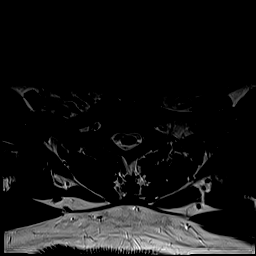
[im 3/31]
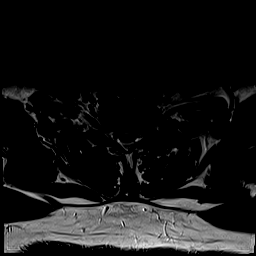
[im 5/31]
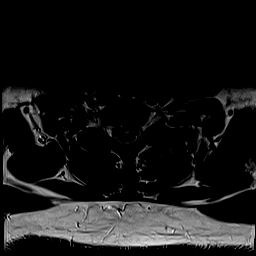
[im 7/31]
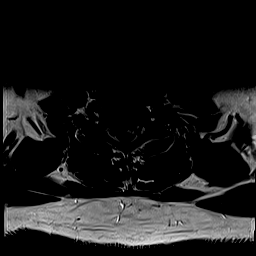
[im 10/31]
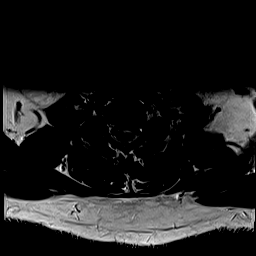
[im 12/31]
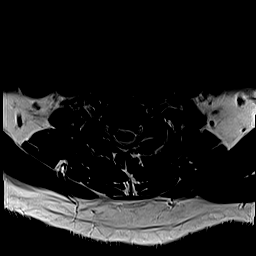
[im 14/31]
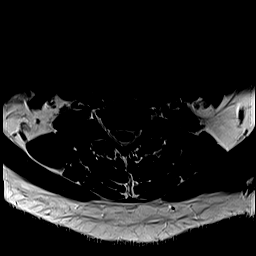
[im 17/31]
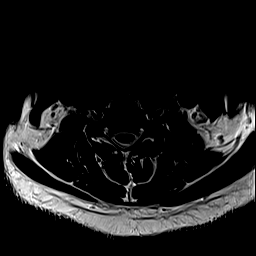
[im 21/31]
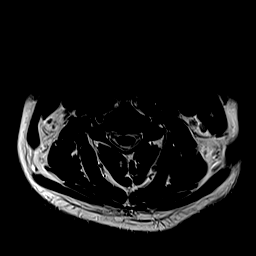
[im 26/31]
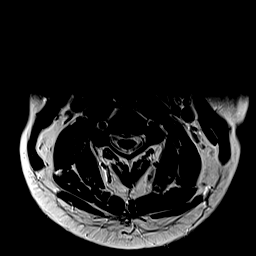
[im 31/31]
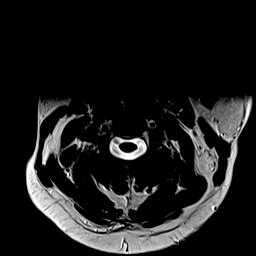

[Series 9: ax mpgr · axial · 3.0mm · 0.35mm/px · z∈[-104,+0]mm · 8 of 31 slices shown]
[im 1/31]
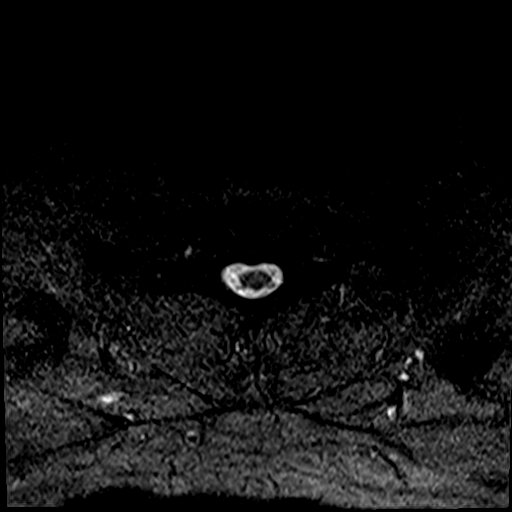
[im 5/31]
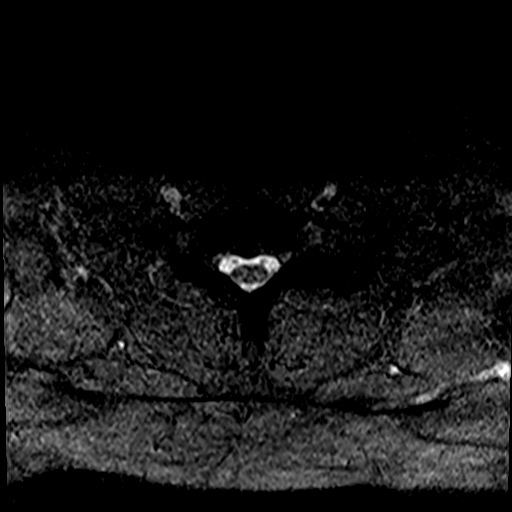
[im 10/31]
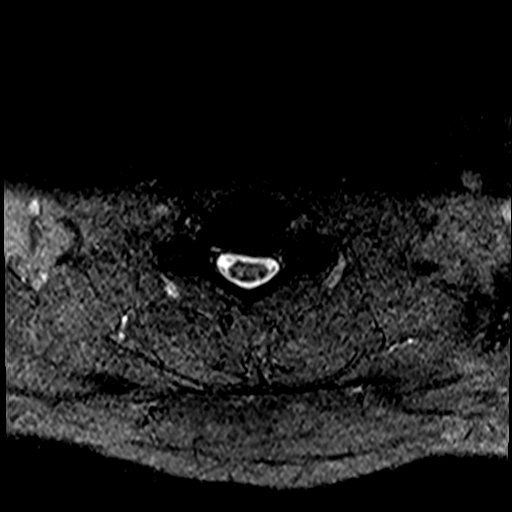
[im 14/31]
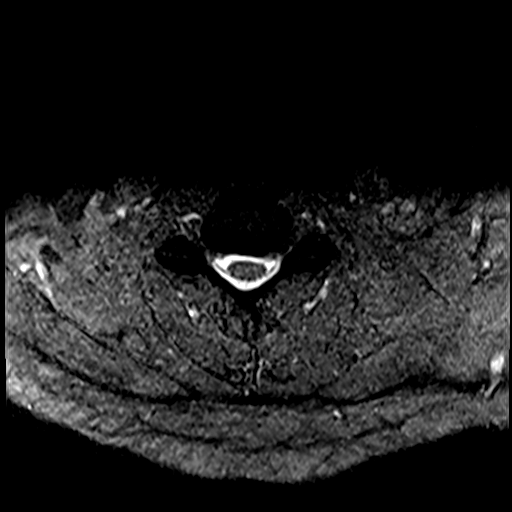
[im 17/31]
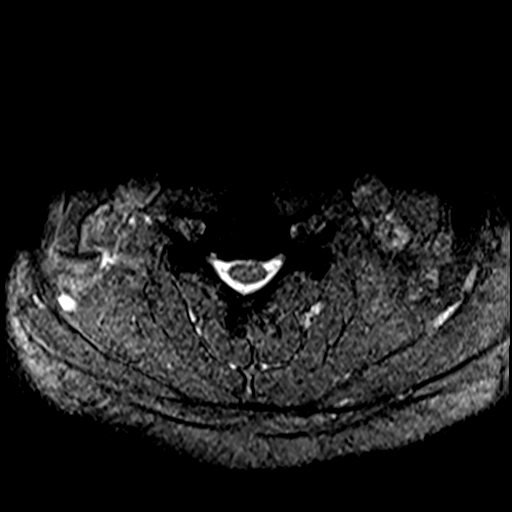
[im 21/31]
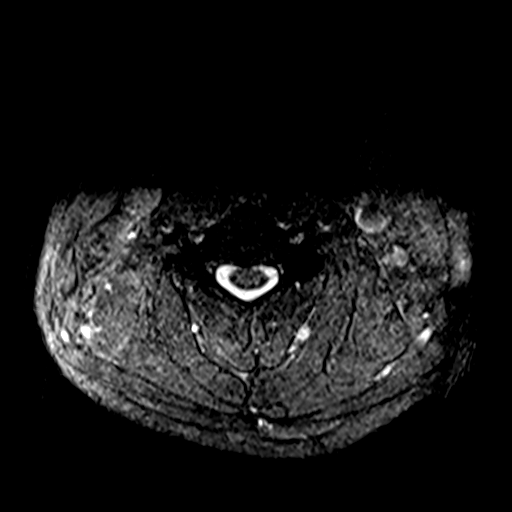
[im 26/31]
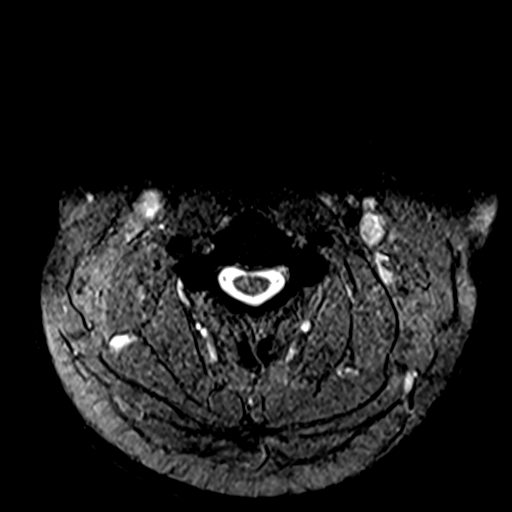
[im 31/31]
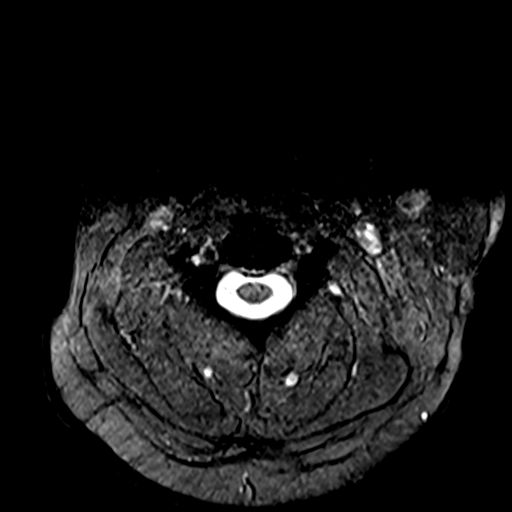

[39 of 48 positions shown; findings below may reference images not displayed]

FINDINGS: Alignment: Nonspecific straightening of the cervical lordosis.
Anteroposterior alignment is maintained.

Vertebrae: Vertebral body heights are preserved. No significant
marrow edema or focal suspicious osseous lesion.

Cord: Normal course and caliber.

Posterior Fossa, vertebral arteries, paraspinal tissues:
Unremarkable.

Disc levels:

C2-C3:  No canal or foraminal stenosis.

C3-C4: Left foraminal disc osteophyte complex. Left uncovertebral
hypertrophy. Minor canal stenosis. No right foraminal stenosis.
Moderate to marked left foraminal stenosis.

C4-C5: Disc bulge endplate osteophytes, and uncovertebral
hypertrophy. Minor canal stenosis. Mild right and mild to moderate
left foraminal stenosis.

C5-C6: Disc bulge, endplate osteophytes, and uncovertebral
hypertrophy. Minor canal stenosis. Mild to moderate right and
moderate left foraminal stenosis.

C6-C7: Disc bulge with superimposed central protrusion, endplate
osteophytes, and facet and uncovertebral hypertrophy. Mild canal
stenosis. Mild right and marked left foraminal stenosis.

C7-T1: Disc bulge with superimposed small central protrusion and
endplate osteophytes. No significant canal or foraminal stenosis.
IMPRESSION: Degenerative changes as detailed above. There is no high-grade canal
stenosis. Multilevel left greater than right foraminal stenosis.

## 2022-03-11 IMAGING — XA DG INJECT/[PERSON_NAME] INC NEEDLE/CATH/PLC EPI/CERV/THOR W/IMG
2 series · 2 of 2 positions shown · non-contrast
Comparison: none

CLINICAL DATA: Cervical spondylosis without myelopathy. Bilateral
neck pain. Good response to the previous right-sided C7-T1 epidural
injection in [DATE]. Pain and stiffness have begun to return,
although the patient's symptoms are still less than they were prior
to the initial injection. The patient states that the current
symptoms are equally bothersome on the right and left sides.

[Series 1: ortho standard · 1 of 1 slices shown (1 of 2)]
[im 1/1]
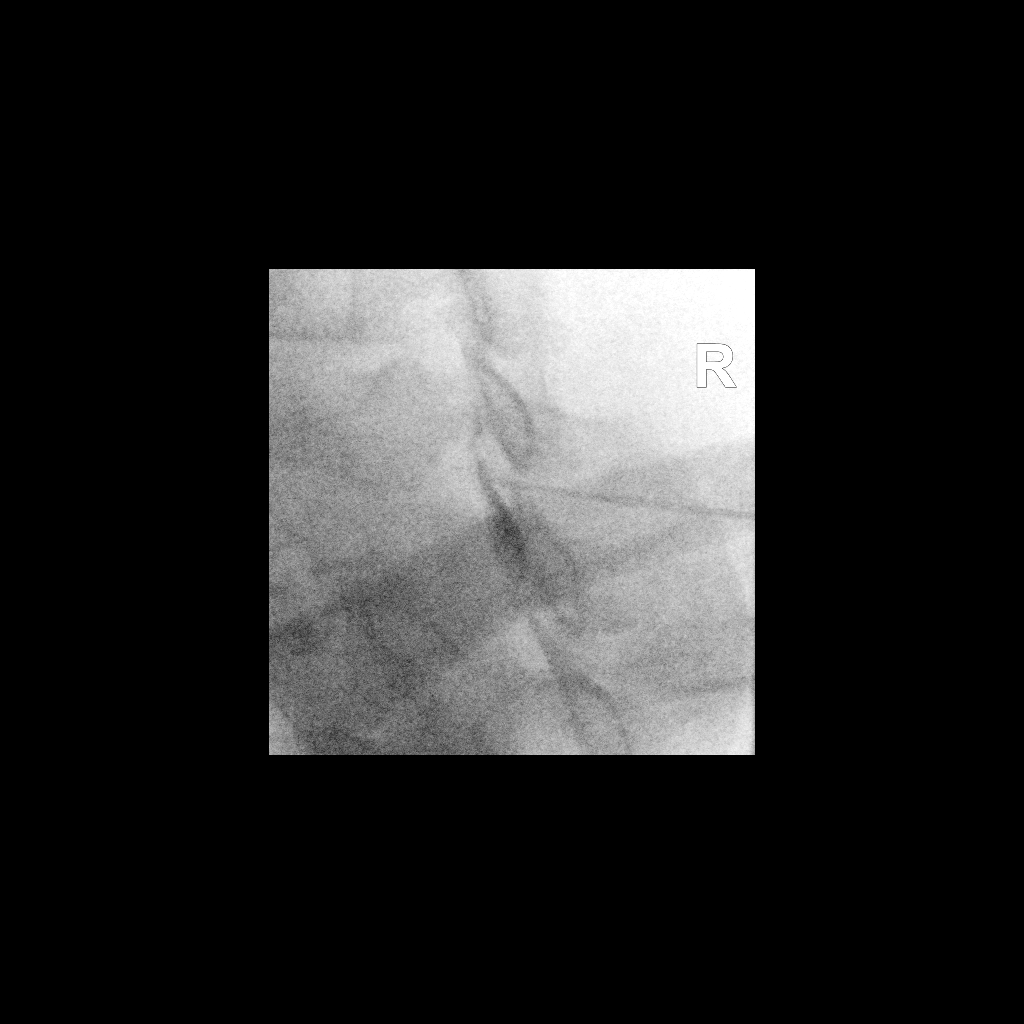

[Series 2: ortho standard · 1 of 1 slices shown (2 of 2)]
[im 1/1]
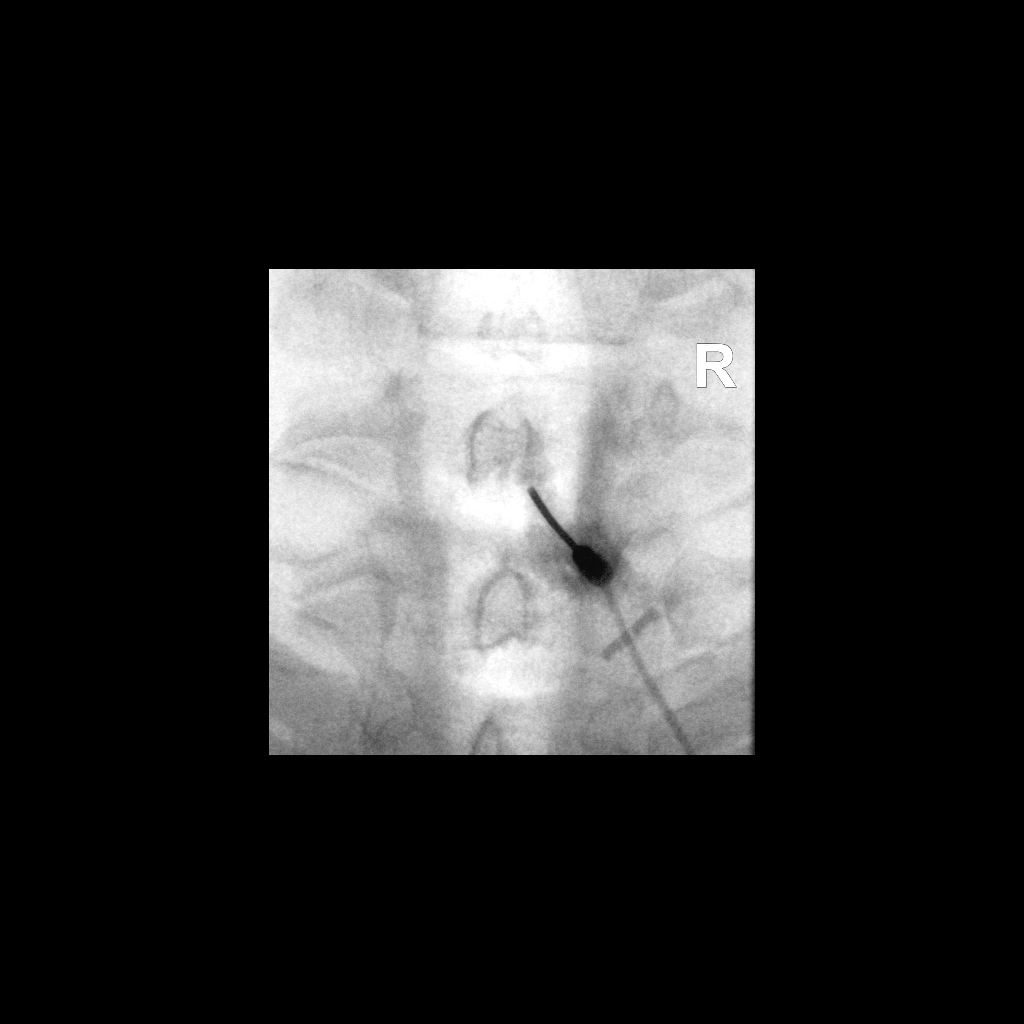

[2 of 2 positions shown; findings below may reference images not displayed]

FLUOROSCOPY TIME:  Fluoroscopy Time: 18 seconds

Radiation Exposure Index: 12.24 microGray*m^2

PROCEDURE:
The procedure, risks, benefits, and alternatives were explained to
the patient. Questions regarding the procedure were encouraged and
answered. The patient understands and consents to the procedure.

CERVICAL EPIDURAL INJECTION

An interlaminar approach was performed on the right at C7-T1. A
inch 20 gauge epidural needle was advanced using loss-of-resistance
technique.

DIAGNOSTIC EPIDURAL INJECTION

Injection of Isovue-M 300 shows a good epidural pattern with spread
above and below the level of needle placement, primarily on the
right. No vascular opacification is seen.

THERAPEUTIC EPIDURAL INJECTION

1.5 ml of Kenalog 40 mixed with 2 ml of normal saline were then
instilled. The procedure was well-tolerated, and the patient was
discharged thirty minutes following the injection in good condition.
IMPRESSION: Technically successful interlaminar epidural injection on the right
at C7-T1.

## 2022-10-17 ENCOUNTER — Other Ambulatory Visit: Payer: Self-pay

## 2022-10-17 MED ORDER — ICOSAPENT ETHYL 1 G PO CAPS
2.0000 g | ORAL_CAPSULE | Freq: Two times a day (BID) | ORAL | 3 refills | Status: AC
  Filled 2022-10-17 – 2022-10-25 (×2): qty 180, 45d supply, fill #0
  Filled 2023-06-11: qty 120, 30d supply, fill #0
  Filled 2023-08-05: qty 120, 30d supply, fill #1

## 2022-10-17 MED ORDER — AMLODIPINE BESYLATE 5 MG PO TABS
5.0000 mg | ORAL_TABLET | Freq: Every day | ORAL | 3 refills | Status: AC
  Filled 2022-10-17: qty 90, 90d supply, fill #0
  Filled 2023-03-24: qty 90, 90d supply, fill #1
  Filled 2023-06-11: qty 90, 90d supply, fill #2

## 2022-10-17 MED ORDER — ATORVASTATIN CALCIUM 10 MG PO TABS
10.0000 mg | ORAL_TABLET | Freq: Every day | ORAL | 3 refills | Status: DC
  Filled 2022-10-17: qty 90, 90d supply, fill #0
  Filled 2023-01-15: qty 90, 90d supply, fill #1
  Filled 2023-04-25: qty 90, 90d supply, fill #2
  Filled 2023-06-11 – 2023-08-05 (×2): qty 90, 90d supply, fill #3

## 2022-10-22 ENCOUNTER — Other Ambulatory Visit: Payer: Self-pay

## 2022-10-23 ENCOUNTER — Other Ambulatory Visit: Payer: Self-pay

## 2022-10-23 MED ORDER — ICOSAPENT ETHYL 1 G PO CAPS
2.0000 g | ORAL_CAPSULE | Freq: Two times a day (BID) | ORAL | 3 refills | Status: DC
  Filled 2022-10-23: qty 180, 45d supply, fill #0
  Filled 2022-10-23: qty 120, 30d supply, fill #0
  Filled 2022-10-24 – 2022-11-01 (×2): qty 180, 45d supply, fill #0
  Filled 2023-01-15: qty 180, 45d supply, fill #1
  Filled 2023-04-25: qty 180, 45d supply, fill #2

## 2022-10-24 ENCOUNTER — Other Ambulatory Visit: Payer: Self-pay

## 2022-10-25 ENCOUNTER — Other Ambulatory Visit: Payer: Self-pay

## 2022-11-01 ENCOUNTER — Other Ambulatory Visit: Payer: Self-pay

## 2022-11-02 ENCOUNTER — Other Ambulatory Visit: Payer: Self-pay

## 2022-12-18 ENCOUNTER — Other Ambulatory Visit: Payer: Self-pay

## 2022-12-18 MED ORDER — HYDROCHLOROTHIAZIDE 25 MG PO TABS
25.0000 mg | ORAL_TABLET | Freq: Every day | ORAL | 1 refills | Status: DC
  Filled 2022-12-18: qty 90, 90d supply, fill #0
  Filled 2023-03-24: qty 90, 90d supply, fill #1

## 2023-03-24 ENCOUNTER — Other Ambulatory Visit: Payer: Self-pay

## 2023-05-02 ENCOUNTER — Other Ambulatory Visit: Payer: Self-pay

## 2023-05-06 ENCOUNTER — Other Ambulatory Visit: Payer: Self-pay

## 2023-05-06 DIAGNOSIS — I10 Essential (primary) hypertension: Secondary | ICD-10-CM | POA: Diagnosis not present

## 2023-05-06 DIAGNOSIS — K429 Umbilical hernia without obstruction or gangrene: Secondary | ICD-10-CM | POA: Diagnosis not present

## 2023-05-06 DIAGNOSIS — R5383 Other fatigue: Secondary | ICD-10-CM | POA: Diagnosis not present

## 2023-05-06 DIAGNOSIS — K219 Gastro-esophageal reflux disease without esophagitis: Secondary | ICD-10-CM | POA: Diagnosis not present

## 2023-05-06 DIAGNOSIS — Z0000 Encounter for general adult medical examination without abnormal findings: Secondary | ICD-10-CM | POA: Diagnosis not present

## 2023-05-06 DIAGNOSIS — E785 Hyperlipidemia, unspecified: Secondary | ICD-10-CM | POA: Diagnosis not present

## 2023-05-06 DIAGNOSIS — G4733 Obstructive sleep apnea (adult) (pediatric): Secondary | ICD-10-CM | POA: Diagnosis not present

## 2023-05-06 DIAGNOSIS — Z6841 Body Mass Index (BMI) 40.0 and over, adult: Secondary | ICD-10-CM | POA: Diagnosis not present

## 2023-05-06 DIAGNOSIS — R0609 Other forms of dyspnea: Secondary | ICD-10-CM | POA: Diagnosis not present

## 2023-05-06 DIAGNOSIS — R5381 Other malaise: Secondary | ICD-10-CM | POA: Diagnosis not present

## 2023-05-06 MED ORDER — HYDROCHLOROTHIAZIDE 25 MG PO TABS
25.0000 mg | ORAL_TABLET | Freq: Every day | ORAL | 3 refills | Status: AC
  Filled 2023-05-06 – 2023-06-11 (×2): qty 90, 90d supply, fill #0

## 2023-05-06 MED ORDER — ICOSAPENT ETHYL 1 G PO CAPS
2.0000 g | ORAL_CAPSULE | Freq: Two times a day (BID) | ORAL | 3 refills | Status: AC
  Filled 2023-05-06 – 2023-06-11 (×2): qty 180, 45d supply, fill #0

## 2023-05-10 DIAGNOSIS — Z6841 Body Mass Index (BMI) 40.0 and over, adult: Secondary | ICD-10-CM | POA: Diagnosis not present

## 2023-05-10 DIAGNOSIS — K429 Umbilical hernia without obstruction or gangrene: Secondary | ICD-10-CM | POA: Diagnosis not present

## 2023-05-14 ENCOUNTER — Other Ambulatory Visit: Payer: Self-pay

## 2023-05-14 DIAGNOSIS — R0602 Shortness of breath: Secondary | ICD-10-CM | POA: Diagnosis not present

## 2023-05-14 MED ORDER — TRELEGY ELLIPTA 100-62.5-25 MCG/ACT IN AEPB
1.0000 | INHALATION_SPRAY | Freq: Every day | RESPIRATORY_TRACT | 3 refills | Status: AC
  Filled 2023-05-14: qty 60, 30d supply, fill #0
  Filled 2023-06-11: qty 60, 30d supply, fill #1
  Filled 2023-10-21: qty 60, 30d supply, fill #2

## 2023-05-20 ENCOUNTER — Other Ambulatory Visit: Payer: Self-pay

## 2023-05-21 DIAGNOSIS — R5383 Other fatigue: Secondary | ICD-10-CM | POA: Diagnosis not present

## 2023-05-21 DIAGNOSIS — R5381 Other malaise: Secondary | ICD-10-CM | POA: Diagnosis not present

## 2023-05-21 DIAGNOSIS — Z0000 Encounter for general adult medical examination without abnormal findings: Secondary | ICD-10-CM | POA: Diagnosis not present

## 2023-05-21 DIAGNOSIS — Z125 Encounter for screening for malignant neoplasm of prostate: Secondary | ICD-10-CM | POA: Diagnosis not present

## 2023-06-07 DIAGNOSIS — D649 Anemia, unspecified: Secondary | ICD-10-CM | POA: Diagnosis not present

## 2023-06-11 ENCOUNTER — Other Ambulatory Visit: Payer: Self-pay

## 2023-06-12 ENCOUNTER — Other Ambulatory Visit: Payer: Self-pay

## 2023-07-17 ENCOUNTER — Other Ambulatory Visit: Payer: Self-pay

## 2023-07-17 DIAGNOSIS — R109 Unspecified abdominal pain: Secondary | ICD-10-CM | POA: Diagnosis not present

## 2023-07-17 MED ORDER — CYCLOBENZAPRINE HCL 5 MG PO TABS
5.0000 mg | ORAL_TABLET | Freq: Every evening | ORAL | 0 refills | Status: AC | PRN
  Filled 2023-07-17: qty 30, 15d supply, fill #0

## 2023-07-22 DIAGNOSIS — R109 Unspecified abdominal pain: Secondary | ICD-10-CM | POA: Diagnosis not present

## 2023-10-21 ENCOUNTER — Other Ambulatory Visit: Payer: Self-pay

## 2023-10-21 MED ORDER — HYDROCHLOROTHIAZIDE 25 MG PO TABS
25.0000 mg | ORAL_TABLET | Freq: Every day | ORAL | 3 refills | Status: AC
  Filled 2023-10-21: qty 90, 90d supply, fill #0

## 2023-10-21 MED ORDER — ATORVASTATIN CALCIUM 10 MG PO TABS
10.0000 mg | ORAL_TABLET | Freq: Every day | ORAL | 3 refills | Status: AC
  Filled 2023-10-21 – 2023-12-09 (×2): qty 90, 90d supply, fill #0

## 2023-10-21 MED ORDER — ICOSAPENT ETHYL 1 G PO CAPS
2.0000 g | ORAL_CAPSULE | Freq: Two times a day (BID) | ORAL | 3 refills | Status: AC
  Filled 2023-10-21: qty 120, 30d supply, fill #0

## 2023-10-21 MED ORDER — TRELEGY ELLIPTA 100-62.5-25 MCG/ACT IN AEPB
1.0000 | INHALATION_SPRAY | Freq: Every day | RESPIRATORY_TRACT | 0 refills | Status: AC
  Filled 2023-10-21: qty 60, 30d supply, fill #0

## 2023-10-21 MED ORDER — AMLODIPINE BESYLATE 5 MG PO TABS
5.0000 mg | ORAL_TABLET | Freq: Every day | ORAL | 3 refills | Status: AC
  Filled 2023-10-21: qty 90, 90d supply, fill #0

## 2023-10-28 DIAGNOSIS — G4733 Obstructive sleep apnea (adult) (pediatric): Secondary | ICD-10-CM | POA: Diagnosis not present

## 2023-12-09 ENCOUNTER — Other Ambulatory Visit: Payer: Self-pay

## 2024-01-01 ENCOUNTER — Other Ambulatory Visit: Payer: Self-pay

## 2024-01-01 MED ORDER — TRELEGY ELLIPTA 100-62.5-25 MCG/ACT IN AEPB
1.0000 | INHALATION_SPRAY | Freq: Every day | RESPIRATORY_TRACT | 2 refills | Status: DC
  Filled 2024-01-01: qty 60, 30d supply, fill #0

## 2024-01-01 MED ORDER — ICOSAPENT ETHYL 1 G PO CAPS
2.0000 g | ORAL_CAPSULE | Freq: Two times a day (BID) | ORAL | 3 refills | Status: AC
  Filled 2024-01-01: qty 120, 30d supply, fill #0

## 2024-01-10 ENCOUNTER — Other Ambulatory Visit: Payer: Self-pay

## 2024-01-27 ENCOUNTER — Other Ambulatory Visit: Payer: Self-pay

## 2024-01-27 MED ORDER — AMLODIPINE BESYLATE 5 MG PO TABS
5.0000 mg | ORAL_TABLET | Freq: Every day | ORAL | 3 refills | Status: AC
  Filled 2024-01-27 (×2): qty 90, 90d supply, fill #0

## 2024-01-27 MED ORDER — ICOSAPENT ETHYL 1 G PO CAPS
2.0000 g | ORAL_CAPSULE | Freq: Two times a day (BID) | ORAL | 3 refills | Status: AC
  Filled 2024-01-27: qty 180, 45d supply, fill #0

## 2024-01-27 MED ORDER — HYDROCHLOROTHIAZIDE 25 MG PO TABS
25.0000 mg | ORAL_TABLET | Freq: Every day | ORAL | 3 refills | Status: AC
  Filled 2024-01-27 (×2): qty 90, 90d supply, fill #0

## 2024-01-27 MED ORDER — ATORVASTATIN CALCIUM 10 MG PO TABS
10.0000 mg | ORAL_TABLET | Freq: Every day | ORAL | 3 refills | Status: AC
  Filled 2024-01-27: qty 90, 90d supply, fill #0

## 2024-01-28 ENCOUNTER — Other Ambulatory Visit: Payer: Self-pay

## 2024-03-04 ENCOUNTER — Other Ambulatory Visit: Payer: Self-pay

## 2024-03-04 ENCOUNTER — Emergency Department

## 2024-03-04 ENCOUNTER — Emergency Department
Admission: EM | Admit: 2024-03-04 | Discharge: 2024-03-04 | Disposition: A | Discharge status: Home / Self Care | Attending: Emergency Medicine | Admitting: Emergency Medicine

## 2024-03-04 ENCOUNTER — Encounter: Payer: Self-pay | Admitting: Emergency Medicine

## 2024-03-04 DIAGNOSIS — X58XXXA Exposure to other specified factors, initial encounter: Secondary | ICD-10-CM | POA: Diagnosis not present

## 2024-03-04 DIAGNOSIS — S299XXA Unspecified injury of thorax, initial encounter: Secondary | ICD-10-CM | POA: Diagnosis present

## 2024-03-04 DIAGNOSIS — R109 Unspecified abdominal pain: Secondary | ICD-10-CM | POA: Diagnosis not present

## 2024-03-04 DIAGNOSIS — R55 Syncope and collapse: Secondary | ICD-10-CM | POA: Insufficient documentation

## 2024-03-04 DIAGNOSIS — S2241XA Multiple fractures of ribs, right side, initial encounter for closed fracture: Secondary | ICD-10-CM | POA: Diagnosis not present

## 2024-03-04 DIAGNOSIS — K429 Umbilical hernia without obstruction or gangrene: Secondary | ICD-10-CM | POA: Diagnosis not present

## 2024-03-04 DIAGNOSIS — K8020 Calculus of gallbladder without cholecystitis without obstruction: Secondary | ICD-10-CM | POA: Diagnosis not present

## 2024-03-04 DIAGNOSIS — R918 Other nonspecific abnormal finding of lung field: Secondary | ICD-10-CM | POA: Diagnosis not present

## 2024-03-04 DIAGNOSIS — R079 Chest pain, unspecified: Secondary | ICD-10-CM | POA: Diagnosis not present

## 2024-03-04 DIAGNOSIS — K5730 Diverticulosis of large intestine without perforation or abscess without bleeding: Secondary | ICD-10-CM | POA: Diagnosis not present

## 2024-03-04 LAB — CBC WITH DIFFERENTIAL/PLATELET
Abs Immature Granulocytes: 0.03 10*3/uL (ref 0.00–0.07)
Basophils Absolute: 0 10*3/uL (ref 0.0–0.1)
Basophils Relative: 0 %
Eosinophils Absolute: 0 10*3/uL (ref 0.0–0.5)
Eosinophils Relative: 0 %
HCT: 44.3 % (ref 39.0–52.0)
Hemoglobin: 15.6 g/dL (ref 13.0–17.0)
Immature Granulocytes: 0 %
Lymphocytes Relative: 12 %
Lymphs Abs: 1.3 10*3/uL (ref 0.7–4.0)
MCH: 32 pg (ref 26.0–34.0)
MCHC: 35.2 g/dL (ref 30.0–36.0)
MCV: 90.8 fL (ref 80.0–100.0)
Monocytes Absolute: 0.6 10*3/uL (ref 0.1–1.0)
Monocytes Relative: 6 %
Neutro Abs: 8.8 10*3/uL — ABNORMAL HIGH (ref 1.7–7.7)
Neutrophils Relative %: 82 %
Platelets: 182 10*3/uL (ref 150–400)
RBC: 4.88 MIL/uL (ref 4.22–5.81)
RDW: 13.2 % (ref 11.5–15.5)
WBC: 10.8 10*3/uL — ABNORMAL HIGH (ref 4.0–10.5)
nRBC: 0 % (ref 0.0–0.2)

## 2024-03-04 LAB — COMPREHENSIVE METABOLIC PANEL
ALT: 21 U/L (ref 0–44)
AST: 21 U/L (ref 15–41)
Albumin: 4.3 g/dL (ref 3.5–5.0)
Alkaline Phosphatase: 63 U/L (ref 38–126)
Anion gap: 10 (ref 5–15)
BUN: 22 mg/dL — ABNORMAL HIGH (ref 6–20)
CO2: 25 mmol/L (ref 22–32)
Calcium: 8.9 mg/dL (ref 8.9–10.3)
Chloride: 103 mmol/L (ref 98–111)
Creatinine, Ser: 0.85 mg/dL (ref 0.61–1.24)
GFR, Estimated: >60 mL/min (ref >60)
Glucose, Bld: 131 mg/dL — ABNORMAL HIGH (ref 70–99)
Potassium: 3.6 mmol/L (ref 3.5–5.1)
Sodium: 138 mmol/L (ref 135–145)
Total Bilirubin: 1 mg/dL (ref 0.0–1.2)
Total Protein: 7.4 g/dL (ref 6.5–8.1)

## 2024-03-04 LAB — URINALYSIS, ROUTINE W REFLEX MICROSCOPIC
Bilirubin Urine: NEGATIVE
Glucose, UA: NEGATIVE mg/dL
Hgb urine dipstick: NEGATIVE
Ketones, ur: NEGATIVE mg/dL
Leukocytes,Ua: NEGATIVE
Nitrite: NEGATIVE
Protein, ur: NEGATIVE mg/dL
Specific Gravity, Urine: >1.046 — ABNORMAL HIGH (ref 1.005–1.030)
pH: 6 (ref 5.0–8.0)

## 2024-03-04 LAB — TROPONIN I (HIGH SENSITIVITY)
Troponin I (High Sensitivity): 19 ng/L — ABNORMAL HIGH (ref <18)
Troponin I (High Sensitivity): 20 ng/L — ABNORMAL HIGH (ref <18)

## 2024-03-04 MED ORDER — KETOROLAC TROMETHAMINE 15 MG/ML IJ SOLN
15.0000 mg | Freq: Once | INTRAMUSCULAR | Status: AC
  Administered 2024-03-04: 15 mg via INTRAVENOUS
  Filled 2024-03-04: qty 1

## 2024-03-04 MED ORDER — HYDROCODONE-ACETAMINOPHEN 5-325 MG PO TABS
1.0000 | ORAL_TABLET | Freq: Once | ORAL | Status: AC
  Administered 2024-03-04: 1 via ORAL
  Filled 2024-03-04: qty 1

## 2024-03-04 MED ORDER — IOHEXOL 350 MG/ML SOLN
100.0000 mL | Freq: Once | INTRAVENOUS | Status: AC | PRN
  Administered 2024-03-04: 100 mL via INTRAVENOUS

## 2024-03-04 MED ORDER — HYDROCODONE-ACETAMINOPHEN 5-325 MG PO TABS
1.0000 | ORAL_TABLET | Freq: Four times a day (QID) | ORAL | 0 refills | Status: AC | PRN
  Filled 2024-03-04: qty 12, 3d supply, fill #0

## 2024-03-04 MED ORDER — MORPHINE SULFATE (PF) 4 MG/ML IV SOLN
4.0000 mg | Freq: Once | INTRAVENOUS | Status: AC
  Administered 2024-03-04: 4 mg via INTRAVENOUS
  Filled 2024-03-04: qty 1

## 2024-03-04 NOTE — ED Triage Notes (Addendum)
 Patient ambulatory to triage with slow, steady gait, appears uncomfortable; pt reports that he awoke around midnight with some pain to his left side/flank (has been dealing with this pain intermittently for several mos, ?stone); went to [facility] and was unable to void, became hot then dizzy and had syncopal episode; wife found him lying on the floor; initially had HA (believes he hit his head) and now upper chest pain radiating around into upper back that increases with any movement; no cervical tenderness with palpation

## 2024-03-04 NOTE — Discharge Instructions (Signed)
 You were seen in the emergency department today for evaluation after passing out in the fall.  Your testing fortunately did not show an emergency cause for you passing out, but it did demonstrate that you have to rib fractures on your right side that I suspect are contributing to your pain.  Please make sure you are staying hydrated and eating regular meals.  Be careful when you are transitioning from sitting to standing.    You can take 650 mg of Tylenol and 600 mg of ibuprofen every 4-6 hours to help with your pain.  You can also use over-the-counter lidocaine patches.  If you have breakthrough pain, I sent a short course of narcotic medicine to your pharmacy.  This can make you drowsy, do not drive or operate machinery when taking this.  Follow-up with your primary care doctor for further evaluation.  Return to the ER for new or worsening symptoms.

## 2024-03-04 NOTE — ED Provider Notes (Signed)
 [facility] Provider Note    Event Date/Time   First MD Initiated Contact with Patient 03/04/24 0700     (approximate)   History   Loss of Consciousness   HPI  Devon Miranda is a 53 year old male presenting to the emergency department for evaluation following a syncopal episode.  Around midnight patient went to use the bathroom and felt like he was straining.  Additionally reports some ongoing left flank pain that was worse tonight.  He then remembers feeling dizzy and was found on the floor by his wife.  Since that time, he reports pain over his chest and abdomen radiating into his back, reproducible with tenderness.  Initially had a headache, not sudden in onset or different than prior, now resolved.  No numbness, tingling, focal weakness.  History of vasovagal syncope in the past.    Physical Exam   Triage Vital Signs: ED Triage Vitals  Encounter Vitals Group     BP 03/04/24 0651 139/74     Systolic BP Percentile --      Diastolic BP Percentile --      Pulse Rate 03/04/24 0651 66     Resp 03/04/24 0651 20     Temp 03/04/24 0704 98.3 F (36.8 C)     Temp Source 03/04/24 0704 Oral     SpO2 03/04/24 0651 100 %     Weight 03/04/24 0641 (!) 305 lb (138.3 kg)     Height 03/04/24 0641 6\' 1"  (1.854 m)     Head Circumference --      Peak Flow --      Pain Score 03/04/24 0641 8     Pain Loc --      Pain Education --      Exclude from Growth Chart --     Most recent vital signs: Vitals:   03/04/24 1030 03/04/24 1045  BP: 119/71   Pulse: 62   Resp: 13   Temp:  98 F (36.7 C)  SpO2: 99%     Nursing notes and vital signs reviewed.  General: Adult male, lying in bed, awake, appears uncomfortable Head: Atraumatic Chest: Symmetric chest rise, tenderness to palpation throughout the anterior chest wall Cardiac: Regular rhythm and rate.  Respiratory: Lungs clear to auscultation Abdomen: Soft, nondistended.  Tenderness to palpation Pelvis: Stable  in AP and lateral compression. No tenderness to palpation. MSK: No deformity to bilateral upper and lower extremity.  Full range of motion of extremities.  Neuro: Alert, oriented. GCS 15. 5 out of 5 strength in bilateral upper and lower extremities. Normal sensation to light touch in bilateral upper and lower extremity. Skin: No evidence of burns or lacerations. ED Results / Procedures / Treatments   Labs (all labs ordered are listed, but only abnormal results are displayed) Labs Reviewed  CBC WITH DIFFERENTIAL/PLATELET - Abnormal; Notable for the following components:      Result Value   WBC 10.8 (*)    Neutro Abs 8.8 (*)    All other components within normal limits  COMPREHENSIVE METABOLIC PANEL - Abnormal; Notable for the following components:   Glucose, Bld 131 (*)    BUN 22 (*)    All other components within normal limits  URINALYSIS, ROUTINE W REFLEX MICROSCOPIC - Abnormal; Notable for the following components:   Color, Urine YELLOW (*)    APPearance CLEAR (*)    Specific Gravity, Urine >1.046 (*)    All other components within normal limits  TROPONIN I (HIGH SENSITIVITY) -  Abnormal; Notable for the following components:   Troponin I (High Sensitivity) 19 (*)    All other components within normal limits  TROPONIN I (HIGH SENSITIVITY) - Abnormal; Notable for the following components:   Troponin I (High Sensitivity) 20 (*)    All other components within normal limits     EKG EKG independently reviewed interpreted by myself (ER attending) demonstrates:  EKG demonstrates sinus rhythm rate of 61, PR 146, QRS 112, QTc 440, no acute ST changes  RADIOLOGY Imaging independently reviewed and interpreted by myself demonstrates:  CXR without focal consolidation  PROCEDURES:  Critical Care performed: No  Procedures   MEDICATIONS ORDERED IN ED: Medications  HYDROcodone-acetaminophen (NORCO/VICODIN) 5-325 MG per tablet 1 tablet (has no administration in time range)  ketorolac  (TORADOL) 15 MG/ML injection 15 mg (has no administration in time range)  morphine (PF) 4 MG/ML injection 4 mg (4 mg Intravenous Given 03/04/24 0730)  iohexol (OMNIPAQUE) 350 MG/ML injection 100 mL (100 mLs Intravenous Contrast Given 03/04/24 0751)     IMPRESSION / MDM / ASSESSMENT AND PLAN / ED COURSE  I reviewed the triage vital signs and the nursing notes.  Differential diagnosis includes, but is not limited to, arrhythmia Anemia, electrolyte abnormality, ACS, dissection, vasovagal syncope, traumatic injury, lower suspicion acute intracranial bleed, other acute intracranial process in the absence of significant headache, neurologic deficits  Patient's presentation is most consistent with acute presentation with potential threat to life or bodily function.  53 year old male presenting to the emergency department for evaluation following a syncopal episode with multiple areas of tenderness on exam.  Stable vitals on exam.  Will obtain labs, EKG, x-Levita Monical to further evaluate.  Headache has now resolved and has a reassuring neurologic exam, do think it is okay to defer head imaging, but with syncope, chest pain, tenderness throughout his chest and abdomen will obtain CT dissection protocol to further evaluate.  Ordered for morphine for pain control.  Clinical Course as of 03/04/24 1119  Wed Mar 04, 2024  0930 CT Angio Chest/Abd/Pel for Dissection W and/or Wo Contrast CT without dissection, does note right sixth and seventh nondisplaced rib fractures which may be contributing to patient's chest pain.  No other acute intra-abdominal findings.  Minimally elevated initial troponin, will obtain repeat.  Urinalysis pending. [NR]  1114 Urinalysis resulted without evidence of infection, does have some signs of dehydration.  Repeat troponin was stable minimal elevation, overall low suspicion for cardiac syncope.  Suspect likely vasovagal syncope in the setting of poor sleep, dehydration, straining.  Patient  reassessed.  Does have some ongoing pain, but improved after medication.  Discussed results of workup.  Discussed supportive care in the setting of his nondisplaced rib fractures.  Patient was given an incentive spirometer and was able to pull 1750.  He is comfortable with discharge home with pain control.  Strict return precautions provided.  Patient discharged stable condition. [NR]    Clinical Course User Index [NR] Trinna Post, MD     FINAL CLINICAL IMPRESSION(S) / ED DIAGNOSES   Final diagnoses:  Syncope and collapse  Closed fracture of multiple ribs of right side, initial encounter  Left flank pain     Rx / DC Orders   ED Discharge Orders          Ordered    HYDROcodone-acetaminophen (NORCO/VICODIN) 5-325 MG tablet  Every 6 hours PRN        03/04/24 1119  Note:  This document was prepared using Dragon voice recognition software and may include unintentional dictation errors.   Trinna Post, MD 03/04/24 [phone]

## 2024-03-09 ENCOUNTER — Other Ambulatory Visit (HOSPITAL_COMMUNITY): Payer: Self-pay

## 2024-03-09 ENCOUNTER — Other Ambulatory Visit: Payer: Self-pay

## 2024-03-10 ENCOUNTER — Other Ambulatory Visit: Payer: Self-pay

## 2024-03-11 ENCOUNTER — Other Ambulatory Visit: Payer: Self-pay

## 2024-03-11 MED ORDER — HYDROCODONE-ACETAMINOPHEN 5-325 MG PO TABS
1.0000 | ORAL_TABLET | Freq: Four times a day (QID) | ORAL | 0 refills | Status: AC | PRN
Start: 2024-03-10 — End: ?
  Filled 2024-03-11: qty 20, 5d supply, fill #0

## 2024-03-19 DIAGNOSIS — E785 Hyperlipidemia, unspecified: Secondary | ICD-10-CM | POA: Diagnosis not present

## 2024-03-19 DIAGNOSIS — R55 Syncope and collapse: Secondary | ICD-10-CM | POA: Diagnosis not present

## 2024-03-19 DIAGNOSIS — E860 Dehydration: Secondary | ICD-10-CM | POA: Diagnosis not present

## 2024-03-19 DIAGNOSIS — G4733 Obstructive sleep apnea (adult) (pediatric): Secondary | ICD-10-CM | POA: Diagnosis not present

## 2024-03-19 DIAGNOSIS — I10 Essential (primary) hypertension: Secondary | ICD-10-CM | POA: Diagnosis not present

## 2024-03-26 ENCOUNTER — Other Ambulatory Visit: Payer: Self-pay

## 2024-03-26 MED ORDER — ICOSAPENT ETHYL 1 G PO CAPS
2.0000 g | ORAL_CAPSULE | Freq: Two times a day (BID) | ORAL | 3 refills | Status: AC
  Filled 2024-03-26: qty 180, 45d supply, fill #0

## 2024-04-07 ENCOUNTER — Other Ambulatory Visit: Payer: Self-pay

## 2024-04-27 ENCOUNTER — Other Ambulatory Visit: Payer: Self-pay

## 2024-04-27 DIAGNOSIS — G4733 Obstructive sleep apnea (adult) (pediatric): Secondary | ICD-10-CM | POA: Diagnosis not present

## 2024-04-27 MED ORDER — ALBUTEROL SULFATE HFA 108 (90 BASE) MCG/ACT IN AERS
2.0000 | INHALATION_SPRAY | RESPIRATORY_TRACT | 5 refills | Status: AC | PRN
Start: 2024-04-27 — End: ?
  Filled 2024-04-27: qty 18, 30d supply, fill #0

## 2024-04-28 ENCOUNTER — Other Ambulatory Visit: Payer: Self-pay

## 2024-04-28 MED ORDER — ATORVASTATIN CALCIUM 10 MG PO TABS
10.0000 mg | ORAL_TABLET | Freq: Every day | ORAL | 3 refills | Status: AC
Start: 2024-04-28 — End: ?
  Filled 2024-04-28: qty 90, 90d supply, fill #0

## 2024-05-25 ENCOUNTER — Other Ambulatory Visit: Payer: Self-pay

## 2024-05-25 MED ORDER — ATORVASTATIN CALCIUM 10 MG PO TABS: 10.0000 mg | ORAL_TABLET | Freq: Every day | ORAL | 3 refills | Status: AC

## 2024-05-25 MED ORDER — ICOSAPENT ETHYL 1 G PO CAPS
2.0000 g | ORAL_CAPSULE | Freq: Two times a day (BID) | ORAL | 3 refills | Status: AC
  Filled 2024-05-25: qty 360, 90d supply, fill #0
  Filled 2025-01-18: qty 240, 60d supply, fill #1

## 2024-05-25 MED ORDER — AMLODIPINE BESYLATE 5 MG PO TABS
5.0000 mg | ORAL_TABLET | Freq: Every day | ORAL | 3 refills | Status: AC
  Filled 2024-05-25: qty 90, 90d supply, fill #0

## 2024-05-25 MED ORDER — HYDROCHLOROTHIAZIDE 25 MG PO TABS
25.0000 mg | ORAL_TABLET | Freq: Every day | ORAL | 3 refills | Status: AC
  Filled 2024-05-25: qty 90, 90d supply, fill #0

## 2024-05-26 ENCOUNTER — Other Ambulatory Visit: Payer: Self-pay

## 2024-05-26 DIAGNOSIS — Z0000 Encounter for general adult medical examination without abnormal findings: Secondary | ICD-10-CM | POA: Diagnosis not present

## 2024-06-01 DIAGNOSIS — I10 Essential (primary) hypertension: Secondary | ICD-10-CM | POA: Diagnosis not present

## 2024-06-01 DIAGNOSIS — Z0000 Encounter for general adult medical examination without abnormal findings: Secondary | ICD-10-CM | POA: Diagnosis not present

## 2024-06-01 DIAGNOSIS — Z1331 Encounter for screening for depression: Secondary | ICD-10-CM | POA: Diagnosis not present

## 2024-06-01 DIAGNOSIS — Z6841 Body Mass Index (BMI) 40.0 and over, adult: Secondary | ICD-10-CM | POA: Diagnosis not present

## 2024-06-01 DIAGNOSIS — E785 Hyperlipidemia, unspecified: Secondary | ICD-10-CM | POA: Diagnosis not present

## 2024-06-01 DIAGNOSIS — G4733 Obstructive sleep apnea (adult) (pediatric): Secondary | ICD-10-CM | POA: Diagnosis not present

## 2024-09-21 ENCOUNTER — Other Ambulatory Visit: Payer: Self-pay

## 2024-09-21 MED ORDER — HYDROCHLOROTHIAZIDE 25 MG PO TABS
25.0000 mg | ORAL_TABLET | Freq: Every day | ORAL | 3 refills | Status: AC
  Filled 2024-09-21: qty 90, 90d supply, fill #0
  Filled 2025-01-18: qty 90, 90d supply, fill #1

## 2024-09-21 MED ORDER — ICOSAPENT ETHYL 1 G PO CAPS
2.0000 g | ORAL_CAPSULE | Freq: Two times a day (BID) | ORAL | 3 refills | Status: AC
  Filled 2024-09-21: qty 180, 45d supply, fill #0

## 2024-09-21 MED ORDER — ALBUTEROL SULFATE HFA 108 (90 BASE) MCG/ACT IN AERS
2.0000 | INHALATION_SPRAY | RESPIRATORY_TRACT | 5 refills | Status: AC | PRN
  Filled 2024-09-21: qty 18, 30d supply, fill #0

## 2024-09-21 MED ORDER — AMLODIPINE BESYLATE 5 MG PO TABS
5.0000 mg | ORAL_TABLET | Freq: Every day | ORAL | 3 refills | Status: AC
  Filled 2024-09-21: qty 90, 90d supply, fill #0
  Filled 2025-01-18: qty 90, 90d supply, fill #1

## 2025-01-18 ENCOUNTER — Other Ambulatory Visit: Payer: Self-pay

## 2025-01-19 ENCOUNTER — Other Ambulatory Visit: Payer: Self-pay

## 2025-01-19 MED ORDER — HYDROCHLOROTHIAZIDE 25 MG PO TABS
25.0000 mg | ORAL_TABLET | Freq: Every day | ORAL | 3 refills | Status: AC
  Filled 2025-01-19: qty 90, 90d supply, fill #0

## 2025-01-19 MED ORDER — AMLODIPINE BESYLATE 5 MG PO TABS
5.0000 mg | ORAL_TABLET | Freq: Every day | ORAL | 3 refills | Status: AC
  Filled 2025-01-19: qty 90, 90d supply, fill #0

## 2025-01-19 MED ORDER — ICOSAPENT ETHYL 1 G PO CAPS
2.0000 g | ORAL_CAPSULE | Freq: Two times a day (BID) | ORAL | 3 refills | Status: AC
  Filled 2025-01-19: qty 180, 45d supply, fill #0
